# Patient Record
Sex: Male | Born: 1940 | Race: Black or African American | Hispanic: No | Smoking: Never smoker
Health system: Southern US, Community
[De-identification: ages and names within clinical notes are randomized; demographics above are authoritative.]

## PROBLEM LIST (undated history)

## (undated) DIAGNOSIS — J209 Acute bronchitis, unspecified: Secondary | ICD-10-CM

## (undated) DIAGNOSIS — E519 Thiamine deficiency, unspecified: Secondary | ICD-10-CM

## (undated) DIAGNOSIS — D649 Anemia, unspecified: Secondary | ICD-10-CM

## (undated) DIAGNOSIS — A0811 Acute gastroenteropathy due to Norwalk agent: Secondary | ICD-10-CM

## (undated) DIAGNOSIS — Z4800 Encounter for change or removal of nonsurgical wound dressing: Secondary | ICD-10-CM

## (undated) DIAGNOSIS — E569 Vitamin deficiency, unspecified: Secondary | ICD-10-CM

## (undated) DIAGNOSIS — G40109 Localization-related (focal) (partial) symptomatic epilepsy and epileptic syndromes with simple partial seizures, not intractable, without status epilepticus: Secondary | ICD-10-CM

## (undated) DIAGNOSIS — I479 Paroxysmal tachycardia, unspecified: Secondary | ICD-10-CM

## (undated) DIAGNOSIS — R1110 Vomiting, unspecified: Secondary | ICD-10-CM

## (undated) DIAGNOSIS — M6281 Muscle weakness (generalized): Secondary | ICD-10-CM

## (undated) DIAGNOSIS — IMO0002 Reserved for concepts with insufficient information to code with codable children: Secondary | ICD-10-CM

## (undated) DIAGNOSIS — I10 Essential (primary) hypertension: Secondary | ICD-10-CM

## (undated) DIAGNOSIS — Z8739 Personal history of other diseases of the musculoskeletal system and connective tissue: Secondary | ICD-10-CM

## (undated) DIAGNOSIS — R531 Weakness: Secondary | ICD-10-CM

## (undated) DIAGNOSIS — N390 Urinary tract infection, site not specified: Secondary | ICD-10-CM

## (undated) DIAGNOSIS — R569 Unspecified convulsions: Secondary | ICD-10-CM

---

## 2007-05-07 ENCOUNTER — Emergency Department: Payer: Self-pay | Admitting: Unknown Physician Specialty

## 2007-05-07 ENCOUNTER — Other Ambulatory Visit: Payer: Self-pay

## 2007-09-03 ENCOUNTER — Other Ambulatory Visit: Payer: Self-pay

## 2007-09-03 ENCOUNTER — Emergency Department: Payer: Self-pay | Admitting: Emergency Medicine

## 2008-08-17 ENCOUNTER — Emergency Department: Payer: Self-pay | Admitting: Emergency Medicine

## 2011-07-06 ENCOUNTER — Emergency Department: Payer: Self-pay | Admitting: Emergency Medicine

## 2011-09-08 ENCOUNTER — Inpatient Hospital Stay: Payer: Self-pay | Admitting: Internal Medicine

## 2012-02-05 ENCOUNTER — Inpatient Hospital Stay: Payer: Self-pay | Admitting: Internal Medicine

## 2012-02-05 LAB — COMPREHENSIVE METABOLIC PANEL
Albumin: 4.5 g/dL (ref 3.4–5.0)
Alkaline Phosphatase: 73 U/L (ref 50–136)
BUN: 46 mg/dL — ABNORMAL HIGH (ref 7–18)
Bilirubin,Total: 2.3 mg/dL — ABNORMAL HIGH (ref 0.2–1.0)
Co2: 19 mmol/L — ABNORMAL LOW (ref 21–32)
Creatinine: 1.9 mg/dL — ABNORMAL HIGH (ref 0.60–1.30)
EGFR (African American): 46 — ABNORMAL LOW
Glucose: 157 mg/dL — ABNORMAL HIGH (ref 65–99)
Osmolality: 315 (ref 275–301)
Sodium: 151 mmol/L — ABNORMAL HIGH (ref 136–145)
Total Protein: 9.6 g/dL — ABNORMAL HIGH (ref 6.4–8.2)

## 2012-02-05 LAB — CBC
HCT: 56.9 % — ABNORMAL HIGH (ref 40.0–52.0)
HGB: 19.1 g/dL — ABNORMAL HIGH (ref 13.0–18.0)
MCH: 33.8 pg (ref 26.0–34.0)
MCV: 101 fL — ABNORMAL HIGH (ref 80–100)
Platelet: 306 10*3/uL (ref 150–440)
RBC: 5.65 10*6/uL (ref 4.40–5.90)
RDW: 14.3 % (ref 11.5–14.5)
WBC: 7.4 10*3/uL (ref 3.8–10.6)

## 2012-02-05 LAB — URINALYSIS, COMPLETE
Bacteria: NONE SEEN
Glucose,UR: NEGATIVE mg/dL (ref 0–75)
Nitrite: NEGATIVE
Ph: 5 (ref 4.5–8.0)
Protein: 30
RBC,UR: NONE SEEN /HPF (ref 0–5)
Specific Gravity: 1.025 (ref 1.003–1.030)
WBC UR: NONE SEEN /HPF (ref 0–5)

## 2012-02-05 LAB — TROPONIN I: Troponin-I: 0.04 ng/mL

## 2012-02-05 LAB — ETHANOL: Ethanol: <3 mg/dL

## 2012-02-05 LAB — CK TOTAL AND CKMB (NOT AT ARMC): CK-MB: 6.7 ng/mL — ABNORMAL HIGH (ref 0.5–3.6)

## 2012-02-06 LAB — CBC WITH DIFFERENTIAL/PLATELET
HGB: 14.9 g/dL (ref 13.0–18.0)
MCH: 33.8 pg (ref 26.0–34.0)
MCHC: 33.8 g/dL (ref 32.0–36.0)
Metamyelocyte: 13 %
Myelocyte: 1 %
Other Cells Blood: 2
Platelet: 196 10*3/uL (ref 150–440)
Segmented Neutrophils: 46 %

## 2012-02-06 LAB — BASIC METABOLIC PANEL
Calcium, Total: 8.7 mg/dL (ref 8.5–10.1)
Co2: 21 mmol/L (ref 21–32)
EGFR (Non-African Amer.): 56 — ABNORMAL LOW
Glucose: 129 mg/dL — ABNORMAL HIGH (ref 65–99)
Potassium: 3.1 mmol/L — ABNORMAL LOW (ref 3.5–5.1)
Sodium: >160 mmol/L (ref 136–145)

## 2012-02-06 LAB — SODIUM: Sodium: >160 mmol/L (ref 136–145)

## 2012-02-06 LAB — HEPATIC FUNCTION PANEL A (ARMC)
Alkaline Phosphatase: 50 U/L (ref 50–136)
SGOT(AST): 52 U/L — ABNORMAL HIGH (ref 15–37)
SGPT (ALT): 57 U/L
Total Protein: 6.8 g/dL (ref 6.4–8.2)

## 2012-02-07 LAB — BASIC METABOLIC PANEL
Anion Gap: 5 — ABNORMAL LOW (ref 7–16)
BUN: 27 mg/dL — ABNORMAL HIGH (ref 7–18)
Calcium, Total: 8.4 mg/dL — ABNORMAL LOW (ref 8.5–10.1)
Creatinine: 1.09 mg/dL (ref 0.60–1.30)
EGFR (African American): >60
EGFR (Non-African Amer.): >60
Glucose: 106 mg/dL — ABNORMAL HIGH (ref 65–99)
Osmolality: 307 (ref 275–301)
Sodium: 152 mmol/L — ABNORMAL HIGH (ref 136–145)

## 2012-02-08 LAB — BASIC METABOLIC PANEL
Anion Gap: 11 (ref 7–16)
Calcium, Total: 8 mg/dL — ABNORMAL LOW (ref 8.5–10.1)
Chloride: 114 mmol/L — ABNORMAL HIGH (ref 98–107)
Co2: 22 mmol/L (ref 21–32)
Creatinine: 1.07 mg/dL (ref 0.60–1.30)
EGFR (African American): >60
Glucose: 113 mg/dL — ABNORMAL HIGH (ref 65–99)
Osmolality: 293 (ref 275–301)
Potassium: 3.3 mmol/L — ABNORMAL LOW (ref 3.5–5.1)

## 2012-02-08 LAB — URINE CULTURE

## 2012-02-09 LAB — BASIC METABOLIC PANEL
Anion Gap: 10 (ref 7–16)
Chloride: 116 mmol/L — ABNORMAL HIGH (ref 98–107)
Co2: 20 mmol/L — ABNORMAL LOW (ref 21–32)
Creatinine: 0.8 mg/dL (ref 0.60–1.30)
Osmolality: 288 (ref 275–301)
Sodium: 146 mmol/L — ABNORMAL HIGH (ref 136–145)

## 2012-02-11 LAB — CULTURE, BLOOD (SINGLE)

## 2012-07-28 LAB — CK: CK, Total: 6870 U/L — ABNORMAL HIGH (ref 35–232)

## 2012-07-28 LAB — CBC
HGB: 15 g/dL (ref 13.0–18.0)
MCHC: 33.2 g/dL (ref 32.0–36.0)
RBC: 4.98 10*6/uL (ref 4.40–5.90)
WBC: 17.1 10*3/uL — ABNORMAL HIGH (ref 3.8–10.6)

## 2012-07-28 LAB — COMPREHENSIVE METABOLIC PANEL
Alkaline Phosphatase: 73 U/L (ref 50–136)
Anion Gap: 10 (ref 7–16)
Bilirubin,Total: 1.4 mg/dL — ABNORMAL HIGH (ref 0.2–1.0)
Calcium, Total: 9 mg/dL (ref 8.5–10.1)
Chloride: 109 mmol/L — ABNORMAL HIGH (ref 98–107)
Co2: 24 mmol/L (ref 21–32)
Creatinine: 0.89 mg/dL (ref 0.60–1.30)
EGFR (Non-African Amer.): >60
Osmolality: 289 (ref 275–301)
Potassium: 3.8 mmol/L (ref 3.5–5.1)
SGPT (ALT): 27 U/L (ref 12–78)
Sodium: 143 mmol/L (ref 136–145)

## 2012-07-29 ENCOUNTER — Inpatient Hospital Stay: Payer: Self-pay | Admitting: Internal Medicine

## 2012-07-29 LAB — URINALYSIS, COMPLETE
Bilirubin,UR: NEGATIVE
Leukocyte Esterase: NEGATIVE
Ph: 5 (ref 4.5–8.0)
Protein: 100
RBC,UR: 1 /HPF (ref 0–5)
Squamous Epithelial: <1
WBC UR: 1 /HPF (ref 0–5)

## 2012-07-29 LAB — ETHANOL
Ethanol %: <0.003 % (ref 0.000–0.080)
Ethanol: <3 mg/dL

## 2012-07-29 LAB — CK: CK, Total: 6311 U/L — ABNORMAL HIGH (ref 35–232)

## 2012-07-30 LAB — COMPREHENSIVE METABOLIC PANEL
Albumin: 2.1 g/dL — ABNORMAL LOW (ref 3.4–5.0)
Albumin: 2.4 g/dL — ABNORMAL LOW (ref 3.4–5.0)
Anion Gap: 8 (ref 7–16)
Anion Gap: 10 (ref 7–16)
BUN: 17 mg/dL (ref 7–18)
BUN: 16 mg/dL (ref 7–18)
Bilirubin,Total: 0.9 mg/dL (ref 0.2–1.0)
Calcium, Total: 7.4 mg/dL — ABNORMAL LOW (ref 8.5–10.1)
Calcium, Total: 7.6 mg/dL — ABNORMAL LOW (ref 8.5–10.1)
EGFR (African American): >60
EGFR (African American): >60
EGFR (Non-African Amer.): >60
EGFR (Non-African Amer.): >60
Glucose: 128 mg/dL — ABNORMAL HIGH (ref 65–99)
Glucose: 104 mg/dL — ABNORMAL HIGH (ref 65–99)
Osmolality: 286 (ref 275–301)
Potassium: 3.2 mmol/L — ABNORMAL LOW (ref 3.5–5.1)
Potassium: 3.2 mmol/L — ABNORMAL LOW (ref 3.5–5.1)
SGOT(AST): 50 U/L — ABNORMAL HIGH (ref 15–37)
SGPT (ALT): 17 U/L (ref 12–78)
Sodium: 142 mmol/L (ref 136–145)
Sodium: 140 mmol/L (ref 136–145)
Total Protein: 6.8 g/dL (ref 6.4–8.2)

## 2012-07-30 LAB — CBC WITH DIFFERENTIAL/PLATELET
Comment - H1-Com4: NORMAL
HGB: 12.1 g/dL — ABNORMAL LOW (ref 13.0–18.0)
HGB: 12.6 g/dL — ABNORMAL LOW (ref 13.0–18.0)
Lymphocytes: 23 %
MCH: 31.4 pg (ref 26.0–34.0)
MCH: 30.4 pg (ref 26.0–34.0)
MCHC: 34.2 g/dL (ref 32.0–36.0)
MCV: 92 fL (ref 80–100)
MCV: 93 fL (ref 80–100)
Metamyelocyte: 1 %
Metamyelocyte: 2 %
Monocytes: 8 %
Platelet: 128 10*3/uL — ABNORMAL LOW (ref 150–440)
RBC: 3.84 10*6/uL — ABNORMAL LOW (ref 4.40–5.90)
RBC: 4.15 10*6/uL — ABNORMAL LOW (ref 4.40–5.90)
Segmented Neutrophils: 63 %
WBC: 7.4 10*3/uL (ref 3.8–10.6)
WBC: 8.3 10*3/uL (ref 3.8–10.6)

## 2012-07-30 LAB — MAGNESIUM: Magnesium: 2 mg/dL

## 2012-07-31 LAB — BASIC METABOLIC PANEL
Anion Gap: 9 (ref 7–16)
Chloride: 109 mmol/L — ABNORMAL HIGH (ref 98–107)
Co2: 23 mmol/L (ref 21–32)
Creatinine: 0.82 mg/dL (ref 0.60–1.30)
EGFR (African American): >60
EGFR (Non-African Amer.): >60
Glucose: 102 mg/dL — ABNORMAL HIGH (ref 65–99)
Potassium: 3.2 mmol/L — ABNORMAL LOW (ref 3.5–5.1)
Sodium: 141 mmol/L (ref 136–145)

## 2012-07-31 LAB — URINALYSIS, COMPLETE
Bilirubin,UR: NEGATIVE
Ketone: NEGATIVE
Leukocyte Esterase: NEGATIVE
Ph: 6 (ref 4.5–8.0)
Protein: NEGATIVE
RBC,UR: NONE SEEN /HPF (ref 0–5)
Specific Gravity: 1.009 (ref 1.003–1.030)
Squamous Epithelial: NONE SEEN
WBC UR: 1 /HPF (ref 0–5)

## 2012-07-31 LAB — CK: CK, Total: 1198 U/L — ABNORMAL HIGH (ref 35–232)

## 2012-08-01 LAB — BASIC METABOLIC PANEL WITH GFR
Anion Gap: 8
BUN: 6 mg/dL — ABNORMAL LOW
Calcium, Total: 8 mg/dL — ABNORMAL LOW
Chloride: 108 mmol/L — ABNORMAL HIGH
Co2: 25 mmol/L
Creatinine: 0.71 mg/dL
EGFR (African American): >60
EGFR (Non-African Amer.): >60
Glucose: 95 mg/dL
Osmolality: 279
Potassium: 3.7 mmol/L
Sodium: 141 mmol/L

## 2012-08-01 LAB — CBC WITH DIFFERENTIAL/PLATELET
HCT: 36.7 % — ABNORMAL LOW (ref 40.0–52.0)
HGB: 12.5 g/dL — ABNORMAL LOW (ref 13.0–18.0)
Lymphocytes: 15 %
MCV: 91 fL (ref 80–100)
Monocytes: 15 %
Platelet: 145 10*3/uL — ABNORMAL LOW (ref 150–440)
Promyelocyte: 1 %
RBC: 4.02 10*6/uL — ABNORMAL LOW (ref 4.40–5.90)
RDW: 14 % (ref 11.5–14.5)
Segmented Neutrophils: 64 %
WBC: 7.6 10*3/uL (ref 3.8–10.6)

## 2012-09-01 ENCOUNTER — Observation Stay: Payer: Self-pay | Admitting: Internal Medicine

## 2012-09-01 LAB — URINALYSIS, COMPLETE
Bilirubin,UR: NEGATIVE
Glucose,UR: NEGATIVE mg/dL (ref 0–75)
Ketone: NEGATIVE
Nitrite: NEGATIVE
Ph: 5 (ref 4.5–8.0)
Protein: 30
RBC,UR: 3 /HPF (ref 0–5)
Specific Gravity: 1.025 (ref 1.003–1.030)
WBC UR: 8 /HPF (ref 0–5)

## 2012-09-01 LAB — COMPREHENSIVE METABOLIC PANEL
Albumin: 2.4 g/dL — ABNORMAL LOW (ref 3.4–5.0)
Alkaline Phosphatase: 153 U/L — ABNORMAL HIGH (ref 50–136)
Anion Gap: 10 (ref 7–16)
Bilirubin,Total: 1.1 mg/dL — ABNORMAL HIGH (ref 0.2–1.0)
Calcium, Total: 8.2 mg/dL — ABNORMAL LOW (ref 8.5–10.1)
Co2: 26 mmol/L (ref 21–32)
Creatinine: 0.82 mg/dL (ref 0.60–1.30)
EGFR (Non-African Amer.): >60
Glucose: 99 mg/dL (ref 65–99)
Osmolality: 285 (ref 275–301)
Potassium: 3.3 mmol/L — ABNORMAL LOW (ref 3.5–5.1)
Sodium: 142 mmol/L (ref 136–145)

## 2012-09-01 LAB — PROTIME-INR: Prothrombin Time: 13.7 secs (ref 11.5–14.7)

## 2012-09-01 LAB — CBC
HCT: 34 % — ABNORMAL LOW (ref 40.0–52.0)
HGB: 11.5 g/dL — ABNORMAL LOW (ref 13.0–18.0)
RBC: 3.67 10*6/uL — ABNORMAL LOW (ref 4.40–5.90)
WBC: 8.4 10*3/uL (ref 3.8–10.6)

## 2012-09-01 LAB — DRUG SCREEN, URINE
Amphetamines, Ur Screen: NEGATIVE (ref ?–1000)
Barbiturates, Ur Screen: NEGATIVE (ref ?–200)
MDMA (Ecstasy)Ur Screen: NEGATIVE (ref ?–500)
Opiate, Ur Screen: NEGATIVE (ref ?–300)
Phencyclidine (PCP) Ur S: NEGATIVE (ref ?–25)
Tricyclic, Ur Screen: NEGATIVE (ref ?–1000)

## 2012-09-01 LAB — MAGNESIUM: Magnesium: 1.8 mg/dL

## 2012-09-01 LAB — TROPONIN I: Troponin-I: <0.02 ng/mL

## 2012-09-02 LAB — BASIC METABOLIC PANEL
Calcium, Total: 8.1 mg/dL — ABNORMAL LOW (ref 8.5–10.1)
Chloride: 109 mmol/L — ABNORMAL HIGH (ref 98–107)
Co2: 26 mmol/L (ref 21–32)
EGFR (African American): >60
EGFR (Non-African Amer.): >60
Glucose: 117 mg/dL — ABNORMAL HIGH (ref 65–99)
Osmolality: 291 (ref 275–301)
Potassium: 3.4 mmol/L — ABNORMAL LOW (ref 3.5–5.1)
Sodium: 145 mmol/L (ref 136–145)

## 2012-09-02 LAB — CK: CK, Total: 136 U/L (ref 35–232)

## 2013-01-08 ENCOUNTER — Ambulatory Visit: Payer: Self-pay | Admitting: Gastroenterology

## 2013-04-02 ENCOUNTER — Ambulatory Visit: Payer: Self-pay | Admitting: Family Medicine

## 2013-09-22 ENCOUNTER — Ambulatory Visit: Payer: Self-pay | Admitting: Family Medicine

## 2014-05-22 ENCOUNTER — Other Ambulatory Visit: Payer: Self-pay | Admitting: Family Medicine

## 2014-05-22 LAB — IRON AND TIBC
IRON SATURATION: 30 %
Iron Bind.Cap.(Total): 239 ug/dL — ABNORMAL LOW (ref 250–450)
Iron: 71 ug/dL (ref 65–175)
Unbound Iron-Bind.Cap.: 168 ug/dL

## 2014-05-22 LAB — COMPREHENSIVE METABOLIC PANEL
ALBUMIN: 3 g/dL — AB (ref 3.4–5.0)
AST: 12 U/L — AB (ref 15–37)
Alkaline Phosphatase: 85 U/L
Anion Gap: 7 (ref 7–16)
BUN: 5 mg/dL — ABNORMAL LOW (ref 7–18)
Bilirubin,Total: 0.3 mg/dL (ref 0.2–1.0)
Calcium, Total: 8.4 mg/dL — ABNORMAL LOW (ref 8.5–10.1)
Chloride: 108 mmol/L — ABNORMAL HIGH (ref 98–107)
Co2: 26 mmol/L (ref 21–32)
Creatinine: 0.76 mg/dL (ref 0.60–1.30)
EGFR (Non-African Amer.): >60
GLUCOSE: 111 mg/dL — AB (ref 65–99)
Osmolality: 279 (ref 275–301)
Potassium: 4 mmol/L (ref 3.5–5.1)
SGPT (ALT): 13 U/L (ref 12–78)
Sodium: 141 mmol/L (ref 136–145)
Total Protein: 7.2 g/dL (ref 6.4–8.2)

## 2014-05-22 LAB — CBC WITH DIFFERENTIAL/PLATELET
COMMENT - H1-COM1: NORMAL
HCT: 38.1 % — ABNORMAL LOW (ref 40.0–52.0)
HGB: 12.4 g/dL — ABNORMAL LOW (ref 13.0–18.0)
Lymphocytes: 34 %
MCH: 30.3 pg (ref 26.0–34.0)
MCHC: 32.5 g/dL (ref 32.0–36.0)
MCV: 93 fL (ref 80–100)
Monocytes: 11 %
Myelocyte: 2 %
PLATELETS: 187 10*3/uL (ref 150–440)
RBC: 4.09 10*6/uL — ABNORMAL LOW (ref 4.40–5.90)
RDW: 13.8 % (ref 11.5–14.5)
SEGMENTED NEUTROPHILS: 53 %
WBC: 4.8 10*3/uL (ref 3.8–10.6)

## 2014-05-22 LAB — LIPID PANEL
CHOLESTEROL: 100 mg/dL (ref 0–200)
HDL Cholesterol: 25 mg/dL — ABNORMAL LOW (ref 40–60)
Ldl Cholesterol, Calc: 53 mg/dL (ref 0–100)
TRIGLYCERIDES: 109 mg/dL (ref 0–200)
VLDL Cholesterol, Calc: 22 mg/dL (ref 5–40)

## 2014-05-22 LAB — FOLATE: Folic Acid: 68.5 ng/mL (ref 3.1–100.0)

## 2015-02-10 ENCOUNTER — Other Ambulatory Visit: Payer: Self-pay | Admitting: Family Medicine

## 2015-02-10 LAB — URINALYSIS, COMPLETE
Bacteria: NONE SEEN
Bilirubin,UR: NEGATIVE
Glucose,UR: NEGATIVE mg/dL
Ketone: NEGATIVE
Leukocyte Esterase: NEGATIVE
Nitrite: NEGATIVE
Ph: 5
Protein: 30
RBC,UR: 2 /HPF
Specific Gravity: 1.025
Squamous Epithelial: NONE SEEN
WBC UR: 3 /HPF

## 2015-02-10 LAB — CBC WITH DIFFERENTIAL/PLATELET
Basophil #: 0.1 x10 3/mm 3
Basophil %: 0.7 %
Eosinophil #: 0 x10 3/mm 3
Eosinophil %: 0.5 %
HCT: 41.8 %
HGB: 13.9 g/dL
Lymphocyte %: 21.1 %
Lymphs Abs: 1.6 x10 3/mm 3
MCH: 30.6 pg
MCHC: 33.3 g/dL
MCV: 92 fL
Monocyte #: 2.1 "x10 3/mm " — ABNORMAL HIGH
Monocyte %: 27.4 %
Neutrophil #: 3.9 x10 3/mm 3
Neutrophil %: 50.3 %
Platelet: 81 x10 3/mm 3 — ABNORMAL LOW
RBC: 4.55 x10 6/mm 3
RDW: 13.9 %
WBC: 7.8 x10 3/mm 3

## 2015-02-11 LAB — COMPREHENSIVE METABOLIC PANEL
ALT: 16 U/L — AB
Albumin: 3.5 g/dL
Alkaline Phosphatase: 51 U/L
Anion Gap: 7 (ref 7–16)
BILIRUBIN TOTAL: 0.8 mg/dL
BUN: 15 mg/dL
CALCIUM: 8.3 mg/dL — AB
CHLORIDE: 107 mmol/L
Co2: 29 mmol/L
Creatinine: 0.78 mg/dL
EGFR (African American): >60
Glucose: 116 mg/dL — ABNORMAL HIGH
POTASSIUM: 3.4 mmol/L — AB
SGOT(AST): 22 U/L
SODIUM: 143 mmol/L
Total Protein: 6.7 g/dL

## 2015-02-12 LAB — URINE CULTURE

## 2015-03-09 NOTE — H&P (Signed)
PATIENT NAME:  Nathan Figueroa, Nathan Figueroa MR#:  [id number] DATE OF BIRTH:  12/23/1940  DATE OF ADMISSION:  07/29/2012  PRIMARY CARE PHYSICIAN: [facility] [employer] [facility].   CHIEF COMPLAINT: Found on the floor of the bathroom.   HISTORY OF PRESENT ILLNESS: Mr. Nathan Figueroa is a 74 year old African American male with a history of chronic alcoholism. The last time admitted to this hospital was in March of this year when he was found on the floor unresponsive. He was treated for metabolic encephalopathy, possible seizure The patient lives at home alone and, again, he was found in the bathroom on the floor between the toilet and the wall. He has some bruises over the back area and the shoulder and lower back. The patient is not providing much history. He thinks he is at home. He is sleepy and lethargic, but arousable. He does not give any details or information of what exactly happened. He indicated that he was not taking his medications for several weeks. He admits that he is still drinking alcohol. There is evidence of rhabdomyolysis with CPK reaching 6,870. He is slightly tachycardic and there is evidence of leukocytosis and the source is unclear. The patient was admitted for further evaluation and management.   REVIEW OF SYSTEMS: Ten point system review is unobtainable due to the patient's lethargy, altered mental status and confusion.   PAST MEDICAL HISTORY:  1. Chronic alcoholism. Last admission here was in March of this year with unresponsive episode when he was found on the floor with metabolic encephalopathy and possible seizure. It was unclear whether it was withdrawal seizure from his alcohol or it was primary seizure disorder. 2. History of hypertension.  3. Also, his medical records indicate history of prostatic cancer.   SOCIAL HABITS: Nonsmoker. He is a chronic alcoholic, primarily beer, the amount is unknown. It is also unclear when was his last drink.   PAST SURGICAL HISTORY: Prostatic surgery.    FAMILY HISTORY: Unobtainable due to the patient's altered mental status. However, his records state that there is a family history of hypertension.   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 He is a chronic alcoholic.   ADMISSION MEDICATIONS: None. He has not been taking his medications for several weeks. According to the records from this hospital, in March he was discharged on the following medications. He is supposed to be on: 1. Thiamine 100 mg daily. 2. Folic acid 1 mg daily. 3. Keppra 750 mg twice a day. 4. Norvasc 5 mg a day.  5. Ativan p.r.n. for his anxiety.   ALLERGIES: No known drug allergies.   PHYSICAL EXAMINATION:  VITAL SIGNS: Blood pressure 147/96, respiratory rate 18, pulse 121, temperature 98.4, oxygen saturation 93%.   GENERAL APPEARANCE: Elderly male. lethargic, lying down on stretcher, sleepy but arousable, in no acute distress.   HEAD: No pallor. No icterus. No cyanosis.   ENT: Hearing was normal. Nasal mucosa, lips, tongue were normal.   EYES: Normal eyelids and conjunctiva. Pupils about 5 mm, equal and reactive to light.   NECK: Supple. Trachea at midline. No thyromegaly. No cervical lymphadenopathy. No masses.   HEART: Normal S1, S2. No S3, S4. No murmur. No gallop. No carotid bruits.   LUNGS: Normal breathing pattern without use of accessory muscles. No rales. No wheezing.   ABDOMEN: Soft without tenderness. No hepatosplenomegaly. No masses. No hernias.   SKIN: Erythema over the back of the shoulder especially on the left and lower back area and buttock secondary  to his fall. There is also some thickening and redness of glans penis and mild superficial ulcer.   MUSCULOSKELETAL: No joint swelling. No clubbing.   NEUROLOGIC: Cranial nerves II through XII are intact. No focal motor deficit. He is able to move his four extremities.   PSYCHIATRIC: The patient is disoriented to place and time. He is confused and appears to be somewhat  encephalopathic.   LABORATORY, DIAGNOSTIC, AND RADIOLOGICAL DATA: EKG showed sinus tachycardia at rate of 123 per minute. Nonspecific T wave abnormalities. Incomplete right bundle branch block, otherwise unremarkable EKG. 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 X-ray of the left shoulder showed no evidence of fracture or any acute abnormality. X-ray of the thoracic spine showed no evidence of acute fracture. Similarly, x-ray of the lumbar spine showed no acute fracture. Serum glucose 124, BUN 21, creatinine 0.8, sodium 143, potassium 3.8, calcium 9. Liver function tests showed protein 8, albumin 3.1. Bilirubin 1.4. AST elevated 116, ALT 27, alkaline phosphatase 73. Total CPK was 6,870. Troponin is normal at 0.04. CBC showed white count of 17,000, hemoglobin 15, hematocrit 45, platelet count 183. Urinalysis was unremarkable except for +3 blood. However, his RBC by microscopy showed only one red blood cell and one white blood cell.   ASSESSMENT:  1. Altered mental status likely secondary to metabolic encephalopathy, question whether he is in postictal phase.  2. Rhabdomyolysis.  3. Seizure disorder, unclear whether this is withdrawal from alcohol or primary seizure activity. It is unclear whether he had seizure activity at home or not.  4. Leukocytosis reaching 17,000. The source is unclear at the time being.  5. Sinus tachycardia. 6. Alcoholic hepatitis.  7. Systemic hypertension.  8. Chronic alcoholism.  9. Noncompliance.  10. History of prostatic cancer.   PLAN:  1. Will admit the patient to the medical floor.  2. Follow-up on his neurologic examination.  3. IV hydration with normal saline. 4. Follow up on the CPK and also kidney function to ensure no compromise to the renal function. 5. Check serum alcohol level.  6. I will resume his medications, the  amlodipine and Keppra.  7. I will consult the social services to look at his situation at home to see if there is any way to prevent readmissions for the same of being alone and unsupervised. I do not have any information from prior records whether he has a LIVING WILL or not and the patient cannot provide me with any meaningful information due to his altered mental status.   TIME SPENT IN EVALUATING THIS PATIENT AND REVIEWING HIS MEDICAL RECORDS: More than 55 minutes.   ____________________________ Carney CornersAmir M. Rudene Rearwish, MD amd:ap D: 07/29/2012 01:58:54 ET T: 07/29/2012 09:58:42 ET JOB#: [id number]  cc: Carney CornersAmir M. Rudene Rearwish, MD, <Dictator> [facility] Carney CornersAMIR M Brenetta Penny MD ELECTRONICALLY SIGNED 07/29/2012 22:25

## 2015-03-09 NOTE — Discharge Summary (Signed)
PATIENT NAME:  Nathan NeighboursWILLIAMSON, Sebastin L MR#:  [id number] DATE OF BIRTH:  Nov 16, 1941  DATE OF ADMISSION:  09/01/2012 DATE OF DISCHARGE:  09/02/2012  Discharged to [facility] skilled nursing facility.   DISCHARGE DIAGNOSES:  1. Weakness and ambulation dysfunction chronic secondary to alcohol abuse and prior rhabdomyolysis.  2. Arthritis.  3. History of seizures.  4. Hypertension.  5. Decubitus ulcers.  6. Mild hypokalemia.  7. Stable chronic anemia.   IMAGING STUDIES: 1. CT scan of the head without contrast which showed no acute intracranial process.  2. X-ray of the bilateral ankles showed no acute fracture or dislocation.   ADMITTING HISTORY AND PHYSICAL: Please see detailed history and physical dictated by Dr. Cherlynn KaiserSainani on 09/01/2012. In brief, this is a 74 year old Caucasian male patient with history of rhabdomyolysis and alcohol abuse who recently was discharged to [facility] skilled nursing facility, was sent home two weeks back under his sister's care. The patient was extremely weak since his discharge from the skilled nursing facility and his sister was unable to care for him and brought him to the Emergency Room. The patient has been admitted to the hospitalist service.   HOSPITAL COURSE: For his weakness, the patient was seen by physical therapy, was recommended skilled nursing facility and he has been accepted back to [facility] where he is being transferred after discussing with his sister who is his power of attorney. The patient complained of bilateral ankle pain which is chronic but worsening and had x-rays which showed arthritis but no fractures or dislocation. His hypertension and seizures were well controlled during the hospital stay.   On the day of discharge, the patient is afebrile. Blood pressure 137/84, saturating 95% on room air with cardiovascular examination being normal. Some tenderness in the bilateral ankles and is being discharged to  skilled nursing facility in fair condition.   DISCHARGE MEDICATIONS:  1. Ultram 50 mg oral 4 times a day as needed for pain.  2. Acetaminophen 650 mg oral every four hours as needed for pain or fever.  3. Keppra 750 mg oral twice a day.  4. Norvasc 5 mg oral once a day.  5. Folic acid 1 mg oral once a day.  6. Thiamine 100 mg oral once a day.   DISCHARGE INSTRUCTIONS: Discharge to skilled nursing facility for further physical therapy. The patient will be on activity as tolerated with assistance at all times to prevent any falls. He will be on cardiac diet. This plan was discussed with the patient and his sister who have verbalized understanding and are okay with the plan.   TIME SPENT TODAY ON THIS DISCHARGE DICTATION ALONG WITH COORDINATING CARE AND COUNSELING OF THE PATIENT: 40 minutes.   ____________________________ Molinda BailiffSrikar R. Connee Ikner, MD srs:ap D: 09/02/2012 14:26:10 ET T: 09/02/2012 14:45:01 ET JOB#: [id number]  cc: Wardell HeathSrikar R. Vonne Mcdanel, MD, <Dictator> Orie FishermanSRIKAR R Vikkie Goeden MD ELECTRONICALLY SIGNED 09/16/2012 13:57

## 2015-03-09 NOTE — H&P (Signed)
PATIENT NAME:  Nathan Figueroa, Nathan Figueroa MR#:  [id number] DATE OF BIRTH:  1941-04-02  DATE OF ADMISSION:  09/01/2012  PRIMARY CARE PHYSICIAN: [facility] [place]   CHIEF COMPLAINT: Difficulty ambulating and generalized weakness.   HISTORY OF PRESENT ILLNESS: This is a 74 year old male who was brought into the Emergency Room today due to generalized weakness and difficulty ambulating and being sitting in one position at home for the past 3 to 4 days. The patient was hospitalized about a month ago for acute rhabdomyolysis and being found down about a month ago, was noted to have significant weakness and, therefore, discharged to a skilled nursing facility for rehab. The patient was discharged from rehab about eight days ago and sent home and his sister watches him at home. As per the sister, the patient has not been doing well at home and he has not been able to get out of bed. He has been lying in the same position for about four days. His p.o. intake has been poor. He has been complaining of generalized body aches all throughout. He significantly mostly complains of lower extremity pain when you try to move him and also has multiple stage II to III decubitus ulcers on his buttocks. The patient due to his poor mobility and significant pain was sent over to the ER for further evaluation. The patient had initial work-up which shows no evidence of acute dehydration or any urinary tract infection or any acute infectious etiology. Hospitalist services were contacted for further treatment and evaluation.   REVIEW OF SYSTEMS: Currently unobtainable given the fact that the patient got some Valium and some morphine for the pain.   PAST MEDICAL HISTORY:  1. History of heavy alcohol abuse.  2. History of alcohol withdrawal seizures. 3. Hypertension. 4. History of rhabdomyolysis.   ALLERGIES: No known drug allergies.   SOCIAL HISTORY: No smoking. Has a history of heavy alcohol abuse but has not drank since his last  hospitalization. No illicit drug abuse. Lives at home with his sister presently.   FAMILY HISTORY: Unobtainable given the patient's mental status and lethargy, however, as per records there is a family history of hypertension.   MEDICATIONS BASED ON HIS PREVIOUS DISCHARGE SUMMARY: 1. Tylenol 650 q.4 hours as needed.  2. Keppra 750 mg b.i.d.  3. Amlodipine 5 mg daily.  4. Folic acid 1 mg daily.  5. Thiamine 100 mg daily.   PHYSICAL EXAMINATION:   VITAL SIGNS: Temperature 98.1, pulse 112, respirations 20, blood pressure 117/90, sats 97% on room air.   GENERAL: He is a lethargic-appearing male in bed but in no apparent distress.   HEENT: Atraumatic, normocephalic. His extraocular muscles are intact. His pupils are equal and reactive to light. Sclerae anicteric. No conjunctival injection. No oropharyngeal erythema.   NECK: Supple. No jugular venous distention, no bruits, no lymphadenopathy, no thyromegaly.   HEART: Regular rate and rhythm, tachycardic. No murmurs, no rubs, no clicks.   LUNGS: Clear to auscultation bilaterally. No rales, no rhonchi, no wheezes.   ABDOMEN: Soft, flat, nontender, nondistended. Has good bowel sounds. No hepatosplenomegaly appreciated.   EXTREMITIES: No evidence of any cyanosis, clubbing, or peripheral edema. Has +2 pedal and radial pulses bilaterally.   NEUROLOGICAL: He is alert, awake, and oriented x1. Globally weak. Difficult to do a full neurological exam. Complains of significant pain on passive motion of his lower extremities.   SKIN: Moist and warm. He does have multiple stage II to III decubitus on his buttocks. He also has some  area of redness in his left lower extremity which extends from his knees all the way down to the ankle likely from being in the same position for a long period of time.   LYMPHATIC: There is no cervical or axillary lymphadenopathy.   LABORATORY, DIAGNOSTIC, AND RADIOLOGICAL DATA: Serum glucose 99, BUN 18, creatinine 0.8,  sodium 142, potassium 3.3, chloride 106, bicarb 26. LFTs are within normal limits. Albumin 2.4. Urine toxicology is negative. White cell count 8.4, hemoglobin 11.5, hematocrit 34.0, platelet count 316. INR 1.0.   The patient did have a CT of the head done without contrast which showed no acute intracranial process.   The patient also had x-ray of his left ankle which shows no acute fracture.  An x-ray of his right ankle shows also no acute fracture.  X-ray of his right shoulder shows no acute fracture or dislocation.  Chest x-ray showed no evidence of acute cardiopulmonary disease.   ASSESSMENT AND PLAN: This is a 74 year old male with history of alcohol abuse, history of alcohol withdrawal seizures, dementia, and history of rhabdomyolysis who presents to the hospital due to generalized weakness and difficulty ambulating. The patient apparently has been sitting in the recliner for the past 4 to 5 days and has not moved well.  1. Weakness/ambulation dysfunction. This is likely chronic in nature related to his deconditioning from his heavy alcohol abuse. He was just discharged from the hospital to [facility] about a month ago and then discharged from [facility] about eight days ago and now cannot get around well. I will get a physical therapy consult to assess his mobility. Will also get Case Management involved for long-term care placement. I do not appreciate any evidence of acute focal metabolic or neurologic abnormality as suspect the cause of his weakness.  2. History of alcohol withdrawal and seizures. The patient has had no history of alcohol use since he was discharged and he has had no seizures. I will continue his Keppra for now.  3. Hypertension. I will continue his Norvasc. 4. Decubitus ulcers likely due to poor mobility. The patient has been followed by the wound team during the last hospitalization. I will reconsult them. Continue local wound care for now.   Plan was discussed  with the patient's power-of-attorney, the sister, and she agrees with the management.   CODE STATUS: The patient is a DO NOT INTUBATE/DO NOT RESUSCITATE.   TIME SPENT: 50 minutes.   ____________________________ Rolly PancakeVivek J. Cherlynn KaiserSainani, MD vjs:drc D: 09/01/2012 21:13:52 ET T: 09/02/2012 08:13:51 ET JOB#: [id number]  cc: Rolly PancakeVivek J. Cherlynn KaiserSainani, MD, <Dictator> [facility] SirenVIVEK J SAINANI MD ELECTRONICALLY SIGNED 09/02/2012 13:47

## 2015-03-09 NOTE — Discharge Summary (Signed)
PATIENT NAME:  Nathan NeighboursWILLIAMSON, Derrian L MR#:  [id number] DATE OF BIRTH:  June 24, 1941  DATE OF ADMISSION:  07/29/2012 DATE OF DISCHARGE:  08/01/2012  DISCHARGE DIAGNOSES: 1. Altered mental status possibly due to alcohol use and alcohol withdrawal seizure. The patient was admitted with similar symptoms in March 2013.  2. Rhabdomyolysis due to lying in the same position for a prolonged period of time.  3. Leukocytosis, resolved. 4. Seizures, possibly alcohol withdrawal versus underlying seizure disorder. 5. Hypertension. 6. Elevated liver function tests due to alcohol use.  7. Hypokalemia.  8. Febrile illness possibly due to wound/decubitus acquired on the buttock area due to lying in the same spot for a prolonged period of time.   DISPOSITION: The patient is being discharged to a rehab facility.   DIET: Low sodium.   DISCHARGE ACTIVITY: As tolerated.   DISCHARGE INSTRUCTIONS: The patient is encouraged to ambulate and use incentive spirometry. He should also followup with the [facility].   DISCHARGE MEDICATIONS:  1. Tylenol 650 mg every four hours p.r.n.  2. Keppra 750 mg twice a day. 3. Amlodipine 5 mg daily.  4. Amoxicillin 875/125 mg twice a day. 5. Folic acid 1 mg daily.  6. Thiamine 100 mg daily.   RESULTS: CT of the head showed no acute abnormalities.   The patient had three chest x-rays. The last one was done on the day of discharge and showed no evidence of an infiltrate.   Microbiology: Blood cultures negative so far.   Urinalysis showed no evidence of any infection.   CBC: White count was 17.1 on admission and has been normal thereafter, hemoglobin 15 to 12.5, and platelet count 128 to 183. CK was 6870 on admission and 975 by the time of discharge. AST 116 on admission and 50 by the time of discharge. Potassium 3.2, supplemented and normal by the time of discharge. Glucose 124. BUN and creatinine are normal. The rest of electrolytes are normal.   HOSPITAL COURSE: The  patient is a 74 year old male with past medical history of alcohol abuse who was admitted with metabolic encephalopathy and possible alcohol withdrawal seizure in March 2013. He was brought in by a family member after he was found on the floor of his house. He had been lying in the same position for a prolonged period of time. His CAT scan of the head was negative. Old records indicated that he did have some underlying dementia. He was treated with IV fluids for his rhabdomyolysis. His CK has continued to improve. His urine function and urine output has been good. He had leukocytosis, possibly reactive, initially. His urinalysis and chest x-ray were negative on admission. He possibly has alcohol withdrawal seizure versus underlying seizure disorder and was discharged on Keppra during his last admission; this has been restarted. The patient has had no further evidence of seizures while in the hospital. His blood pressure remained well controlled on Norvasc. He received potassium replacement. He had intermittent fever. Fever work-up including CBC showed normal white count, chest x-ray showed no infiltrate, blood culture showed no growth, and repeat urinalysis showed no evidence of infection. It was felt that the patient possibly had atelectasis because he was not very mobile and did not want to move much. He also had an area of skin breakdown/blistering on his buttocks that he acquired resulting from lying in the same position for a prolonged period of time. The blisters had ruptured and had some areas of surrounding erythema and raw areas. He is being  discharged on empiric antibiotics and will followup with the [facility].   TIME SPENT: 45 minutes.  ____________________________ Darrick Meigs, MD sp:slb D: 08/01/2012 10:46:05 ET T: 08/01/2012 11:06:27 ET JOB#: [id number]  cc: Darrick Meigs, MD, <Dictator> Darrick Meigs MD ELECTRONICALLY SIGNED 08/01/2012 16:01

## 2015-03-14 NOTE — H&P (Signed)
PATIENT NAME:  Nathan Figueroa MR#:  [id number] DATE OF BIRTH:  05-23-1941  DATE OF ADMISSION:  02/05/2012  PRIMARY CARE PHYS[facility]  CHIEF COMPLAINT: Decrease in responsiveness, found on the floor.   HISTORY OF PRESENT ILLNESS: Patient is a 74 year old African American male with history of alcohol abuse who was last seen by family about five days ago who did not call them, therefore, they became concerned and went to check on him today. They noticed him on the floor unresponsive. Patient had some bruising in his lower extremity and was brought to the ED. In the ER he was very dehydrated, was given IV fluids. He started waking up a little bit and then was given Valium because he was a little tachycardic and was having some jerking by the ED physician. Since receiving the Valium patient again has become very lethargic and drowsy. His family is at bedside. They report that other than that prior to five days he was his baseline. He had not complained of any chest pains. No fevers. No coughing. Had not complained of any abdominal pain, nausea, vomiting, or diarrhea. Patient otherwise currently is unable to give me any review of systems. In the ED his CT scan of the head was negative for any acute bleed or any other abnormalities. He is noted to be severely dehydrated with elevated sodium and a creatinine that is high. Patient also has abnormal liver function tests. Otherwise no other review of systems unobtainable.   PAST MEDICAL HISTORY:  1. Hypertension.  2. Seizure, unclear whether this is related to withdrawal.  3. History of prostate cancer.   PAST SURGICAL HISTORY: Required surgery for prostate cancer in the past.   ALLERGIES: None.   MEDICATIONS:  1. Thiamine 100 daily.  2. Norvasc 5 daily.  3. Lisinopril daily, dose is not known.  4. Keppra 750, 1 tab p.o. b.i.d.  5. Folic acid 1 mg p.o. daily.   SOCIAL HISTORY: No smoking but drinks heavily; according to his daughter as many  beers as he can get his hands on and he lives alone.   FAMILY HISTORY: Positive for hypertension.   REVIEW OF SYSTEMS: Unobtainable.   PHYSICAL EXAMINATION:  VITAL SIGNS: Temperature 99.2, pulse 122, respirations 22, blood pressure 128/101, O2 100%.   GENERAL: Patient currently is poorly responsive, opens his eyes half way and then falls back asleep again. This is after the Valium that he received.   HEENT: Head atraumatic, normocephalic. Pupils equal, round, reactive to light and accommodation. He has got some erythema under his eyelids on both eyes. Nasal exam shows no drainage or ulceration. Oropharynx is very dry. External ear exam shows no erythema or drainage. Nasal exam shows no drainage or ulceration.   NECK: There is no thyromegaly. No carotid bruits.   CARDIOVASCULAR: Tachycardic. No murmurs, rubs, clicks, or gallops. PMI is not displaced.   LUNGS: Clear to auscultation bilaterally without any rales, rhonchi, wheezing.   ABDOMEN: Soft, nontender, nondistended. Positive bowel sounds x4.   EXTREMITIES: He has got no clubbing, cyanosis, edema. Some bruising in both of his lower extremities.   SKIN: There is nonspecific erythema under his eyes as well as areas of bruising in his lower extremity. No other ulceration. Skin is very dry.   VASCULAR: Good DP, PT pulses.   NEUROLOGICAL: Patient is very lethargic, not able to follow commands. Was moving all extremities spontaneously earlier. Babinski's downgoing.   LYMPHATICS: No lymph nodes palpable.   MUSCULOSKELETAL: There is no  erythema or swelling.   LABORATORY, DIAGNOSTIC, AND RADIOLOGICAL DATA: Evaluations in the ED: Glucose 157, BUN 46, creatinine 1.90, sodium 151, potassium 4.4, chloride 116, CO2 19, anion gap 16, calcium 10.9, total protein 9.6, albumin 4.5, bilirubin total 2.3, alkaline phosphatase 73, AST 86, ALT 86. Troponin 0.4. CPK 380, WBC 7.4, hemoglobin 19.1, platelet count 306. Urinalysis shows blood 1+, leukocyte  esterase 1+, WBCs none seen. ABG: pH 7.38, pCO2 21, pO2 73, FiO2 32. CT scan of the head shows no acute abnormality. Chest x-ray PA and lateral shows no acute abnormality.   ASSESSMENT AND PLAN: Patient is a 74 year old African American male with history of alcohol abuse who was last seen five days ago by his family brought in because of unresponsiveness.  1. Unresponsiveness likely due to severe dehydration. He also has a history of alcohol abuse so could be related to withdrawal, also could be seizure. He has a history of seizure. He could have had seizure. Patient's mental status was improving, however, because he was having some tachycardia and jerking he was given Valium, now he is again poorly responsive. Will continue aggressive IV fluids. Follow his mental status. If does not improve will need a neurology evaluation, MRI.  2. Acute renal failure likely due to dehydration. Will give him IV fluids. Follow his renal function.  3. Alcohol abuse. Will place him on CIWA protocol. Will only use haloperidol because he is already drowsy. Not use Ativan, may cause excessive sedation.  4. Hypernatremia likely due to dehydration. Again will give him aggressive IV fluids.  5. Sinus tachycardia, likely due to dehydration. Will monitor him on tele. Follow serial cardiac enzymes.  6. Elevated liver function tests. Again likely due to alcohol abuse. Will repeat LFTs in the morning. May need a liver ultrasound once a little bit more stable.  7. Elevated blood sugar. Will check a hemoglobin A1c.   8. Possible urinary tract infection based on his urinalysis. Will get urine cultures. Place him on Cipro for time being.  9. Miscellaneous: Will place him on Lovenox for deep vein thrombosis prophylaxis.       TIME SPENT: 35 minutes.   ____________________________ Lacie Scotts Allena Katz, MD shp:cms D: 02/05/2012 20:46:00 ET T: 02/06/2012 06:13:14 ET JOB#: [id number]  cc: Maleke Feria H. Allena Katz, MD, <Dictator> [facility]  Charise Carwin MD ELECTRONICALLY SIGNED 02/07/2012 22:15

## 2015-03-14 NOTE — Discharge Summary (Signed)
PATIENT NAME:  Nathan Figueroa, Nathan Figueroa MR#:  [id number] DATE OF BIRTH:  October 06, 1941  DATE OF ADMISSION:  02/05/2012 DATE OF DISCHARGE:  02/09/2012  ADMITTING PHYSICIAN: Auburn Bilberry, MD  DISCHARGING PHYSICIAN: Enid Baas, MD  PRIMARY CARE PHYSICIAN: [facility]  CONSULTANTS: None.  DISCHARGE DIAGNOSES:  1. Unresponsive episode, was found on the floor.  2. Metabolic encephalopathy.  3. History of possible seizure, withdrawal seizure versus primary seizure disorder.  4. Chronic alcohol abuse.  5. Cognitive decline, mild to moderate.  6. Klebsiella urinary tract infection.  7. Acute renal failure.  8. Hypokalemia.  9. Hypernatremia.  10. Elevated liver function tests, likely underlying alcoholic liver disease. 11. Hypertension. 12. Left eye conjunctivitis.  DISCHARGE HOME MEDICATIONS: 1. Thiamine 100 mg p.o. daily.  2. Folic acid 1 mg p.o. daily.  3. Tobrex 0.3% ophthalmic drops to left eye every six hours for four more days.  4. Keppra 750 mg p.o. twice a day. 5. Norvasc 5 mg p.o. daily.  6. Ciprofloxacin 500 mg p.o. twice a day for two more days.  7. Ativan 0.5 to 1 mg p.o. every 8 hours p.r.n. for anxiety.   DISCHARGE DIET: Low-sodium diet.   DISCHARGE ACTIVITY: As tolerated.   FOLLOWUP INSTRUCTIONS:  1. Primary care physician follow-up in two weeks.  2. Check a basic metabolic panel in one week.  3. Physical therapy.   LABS/IMAGING STUDIES: Prior to discharge sodium was 146, potassium 4.0, chloride 116, bicarbonate 20, BUN 8, creatinine 0.8, glucose 82, and calcium 8.4.   WBC 7.5, hemoglobin 14.9, hematocrit 44.2, platelet count 196.   ALT 86, AST 86, alkaline phosphatase 73, total bilirubin 2.3, albumin 4.5, and calcium was 10.9 on admission.   Blood cultures: One set was Coag-negative Staphylococcus. One set likely contamination and the other set is negative.   Urinalysis is positive for any infection.   Urine culture is growing Klebsiella  pneumonia, greater than 100,000 colonies, sensitive to Levaquin and Cipro.   Chest x-ray on admission showed chest radiograph without evidence of acute cardiopulmonary disease.  CT of the head without contrast showed no acute intracranial process, generalized cerebral atrophy, and periventricular white matter changes are present. Cerebrovascular atherosclerotic calcifications are also present.  LFTs have improved to ALT being 57, AST 52, alkaline phosphatase 50, total bilirubin 1.4, and albumin 3.2.  BRIEF HOSPITAL COURSE: Mr. Staples is a 74 year old African American male with past medical history significant for history of seizure disorder whether primary seizure disorder or withdrawal seizure is not known and chronic alcohol abuse who was brought in after he was found unresponsive on the floor. According to the patient's sister, he does have history of alcohol abuse and does drink a lot of beer. When she went to clean his apartment yesterday, she found about 200 empty beer cans. The patient was not seen by any neighbors going outside or moving in the house for five days prior to being found unresponsive. So not sure if he had a seizure and was found to be in postictal confused state or had any other cardiovascular or cerebrovascular  event was not known.   1. Altered mental status likely metabolic encephalopathy. The patient has had periods of agitation and confusion while in the hospital likely thought to be secondary to withdrawal symptoms. His metabolic encephalopathy could have been partly because of alcohol withdrawal and also he was found to have severe electrolyte abnormalities on admission with hypercalcemia and hypernatremia with sodium greater than 160, hypokalemia, etc. With fluids,  withdrawal protocol, and correction of electrolytes his mental status is back to baseline. He does have mild cognition abnormalities and CT of the head shows generalized atrophy. The patient's family denies any  prior dementia, but talking to them he has been having issues with alcohol abuse needing assistance with daily activities from the family. He was on seizure medication, Keppra twice a day, prior to coming to the hospital, which will be continued, and he can continue folic acid and thiamine. He was counseled against alcohol use.  2. Klebsiella urinary tract infection. He was placed on Cipro in the hospital. He can take it for two more days to finish a five day course.  3. Acute renal failure with electrolyte abnormalities including hypokalemia and hypernatremia. This has been resolved and close to baseline. Encouraged to take increased p.o. water and can have a basic metabolic panel check in the next week.  4. 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 Again advised against alcohol drinking.   5. Left eye conjunctivitis. He was started Tobrex ophthalmic drops with significant improvement. Continue for four more days.  6. Hypertension. Continue Norvasc.   His course has been otherwise uneventful in the hospital.   DISCHARGE CONDITION: Stable.   DISCHARGE DISPOSITION: Short-term rehabilitation, as recommended by Physical Therapy.         CODE STATUS: FULL CODE.   TIME SPENT ON DISCHARGE: 45 minutes.  ____________________________ Enid Baasadhika Kyerra Vargo, MD rk:slb D: 02/09/2012 15:20:35 ET T: 02/09/2012 15:46:00 ET JOB#: [id number]  cc: Enid Baasadhika Margherita Collyer, MD, <Dictator> [facility] Enid BaasADHIKA Hattie Pine MD ELECTRONICALLY SIGNED 02/11/2012 0:08

## 2016-04-15 ENCOUNTER — Emergency Department: Payer: Medicare Other

## 2016-04-15 ENCOUNTER — Inpatient Hospital Stay
Admission: EM | Admit: 2016-04-15 | Discharge: 2016-04-20 | DRG: 871 | Disposition: A | Discharge status: Skilled Nursing Facility | Payer: Medicare Other | Attending: Internal Medicine | Admitting: Internal Medicine

## 2016-04-15 ENCOUNTER — Encounter: Payer: Self-pay | Admitting: Emergency Medicine

## 2016-04-15 DIAGNOSIS — N390 Urinary tract infection, site not specified: Secondary | ICD-10-CM

## 2016-04-15 DIAGNOSIS — I10 Essential (primary) hypertension: Secondary | ICD-10-CM | POA: Diagnosis present

## 2016-04-15 DIAGNOSIS — D649 Anemia, unspecified: Secondary | ICD-10-CM | POA: Diagnosis present

## 2016-04-15 DIAGNOSIS — R0902 Hypoxemia: Secondary | ICD-10-CM | POA: Diagnosis present

## 2016-04-15 DIAGNOSIS — N179 Acute kidney failure, unspecified: Secondary | ICD-10-CM | POA: Diagnosis present

## 2016-04-15 DIAGNOSIS — Z7401 Bed confinement status: Secondary | ICD-10-CM

## 2016-04-15 DIAGNOSIS — I479 Paroxysmal tachycardia, unspecified: Secondary | ICD-10-CM | POA: Diagnosis present

## 2016-04-15 DIAGNOSIS — Y95 Nosocomial condition: Secondary | ICD-10-CM | POA: Diagnosis present

## 2016-04-15 DIAGNOSIS — B370 Candidal stomatitis: Secondary | ICD-10-CM | POA: Diagnosis present

## 2016-04-15 DIAGNOSIS — R4182 Altered mental status, unspecified: Secondary | ICD-10-CM

## 2016-04-15 DIAGNOSIS — G9341 Metabolic encephalopathy: Secondary | ICD-10-CM | POA: Diagnosis present

## 2016-04-15 DIAGNOSIS — R509 Fever, unspecified: Secondary | ICD-10-CM

## 2016-04-15 DIAGNOSIS — I214 Non-ST elevation (NSTEMI) myocardial infarction: Secondary | ICD-10-CM | POA: Diagnosis present

## 2016-04-15 DIAGNOSIS — R41841 Cognitive communication deficit: Secondary | ICD-10-CM | POA: Diagnosis present

## 2016-04-15 DIAGNOSIS — F0390 Unspecified dementia without behavioral disturbance: Secondary | ICD-10-CM | POA: Diagnosis present

## 2016-04-15 DIAGNOSIS — Z79899 Other long term (current) drug therapy: Secondary | ICD-10-CM | POA: Diagnosis not present

## 2016-04-15 DIAGNOSIS — A4189 Other specified sepsis: Secondary | ICD-10-CM | POA: Diagnosis not present

## 2016-04-15 DIAGNOSIS — M6281 Muscle weakness (generalized): Secondary | ICD-10-CM | POA: Diagnosis present

## 2016-04-15 DIAGNOSIS — A419 Sepsis, unspecified organism: Secondary | ICD-10-CM

## 2016-04-15 HISTORY — DX: Localization-related (focal) (partial) symptomatic epilepsy and epileptic syndromes with simple partial seizures, not intractable, without status epilepticus: G40.109

## 2016-04-15 HISTORY — DX: Personal history of other diseases of the musculoskeletal system and connective tissue: Z87.39

## 2016-04-15 HISTORY — DX: Paroxysmal tachycardia, unspecified: I47.9

## 2016-04-15 HISTORY — DX: Unspecified convulsions: R56.9

## 2016-04-15 HISTORY — DX: Acute bronchitis, unspecified: J20.9

## 2016-04-15 HISTORY — DX: Essential (primary) hypertension: I10

## 2016-04-15 HISTORY — DX: Thiamine deficiency, unspecified: E51.9

## 2016-04-15 HISTORY — DX: Anemia, unspecified: D64.9

## 2016-04-15 HISTORY — DX: Muscle weakness (generalized): M62.81

## 2016-04-15 HISTORY — DX: Urinary tract infection, site not specified: N39.0

## 2016-04-15 HISTORY — DX: Reserved for concepts with insufficient information to code with codable children: IMO0002

## 2016-04-15 HISTORY — DX: Vitamin deficiency, unspecified: E56.9

## 2016-04-15 HISTORY — DX: Encounter for change or removal of nonsurgical wound dressing: Z48.00

## 2016-04-15 HISTORY — DX: Acute gastroenteropathy due to Norwalk agent: A08.11

## 2016-04-15 HISTORY — DX: Weakness: R53.1

## 2016-04-15 HISTORY — DX: Vomiting, unspecified: R11.10

## 2016-04-15 LAB — CBC WITH DIFFERENTIAL/PLATELET
BASOS ABS: 0.3 10*3/uL — AB (ref 0–0.1)
Basophils Relative: 1 %
EOS ABS: 0 10*3/uL (ref 0–0.7)
Eosinophils Relative: 0 %
HCT: 40 % (ref 40.0–52.0)
HEMOGLOBIN: 13.4 g/dL (ref 13.0–18.0)
LYMPHS PCT: 1 %
Lymphs Abs: 0.3 10*3/uL — ABNORMAL LOW (ref 1.0–3.6)
MCH: 30.1 pg (ref 26.0–34.0)
MCHC: 33.5 g/dL (ref 32.0–36.0)
MCV: 89.9 fL (ref 80.0–100.0)
Monocytes Absolute: 4.5 10*3/uL — ABNORMAL HIGH (ref 0.2–1.0)
Monocytes Relative: 16 %
NEUTROS PCT: 82 %
Neutro Abs: 23 10*3/uL — ABNORMAL HIGH (ref 1.4–6.5)
Platelets: 174 10*3/uL (ref 150–440)
RBC: 4.45 MIL/uL (ref 4.40–5.90)
RDW: 14.1 % (ref 11.5–14.5)
WBC: 28.1 10*3/uL — AB (ref 3.8–10.6)

## 2016-04-15 LAB — LACTIC ACID, PLASMA: Lactic Acid, Venous: 1.5 mmol/L (ref 0.5–2.0)

## 2016-04-15 LAB — COMPREHENSIVE METABOLIC PANEL
ALK PHOS: 108 U/L (ref 38–126)
ALT: 16 U/L — ABNORMAL LOW (ref 17–63)
ANION GAP: 13 (ref 5–15)
AST: 25 U/L (ref 15–41)
Albumin: 3.4 g/dL — ABNORMAL LOW (ref 3.5–5.0)
BILIRUBIN TOTAL: 1.4 mg/dL — AB (ref 0.3–1.2)
BUN: 22 mg/dL — ABNORMAL HIGH (ref 6–20)
CALCIUM: 8.9 mg/dL (ref 8.9–10.3)
CO2: 23 mmol/L (ref 22–32)
Chloride: 109 mmol/L (ref 101–111)
Creatinine, Ser: 1.95 mg/dL — ABNORMAL HIGH (ref 0.61–1.24)
GFR, EST AFRICAN AMERICAN: 38 mL/min — AB (ref >60)
GFR, EST NON AFRICAN AMERICAN: 32 mL/min — AB (ref >60)
Glucose, Bld: 181 mg/dL — ABNORMAL HIGH (ref 65–99)
Potassium: 3.5 mmol/L (ref 3.5–5.1)
SODIUM: 145 mmol/L (ref 135–145)
TOTAL PROTEIN: 8.8 g/dL — AB (ref 6.5–8.1)

## 2016-04-15 LAB — URINALYSIS COMPLETE WITH MICROSCOPIC (ARMC ONLY)
GLUCOSE, UA: NEGATIVE mg/dL
NITRITE: NEGATIVE
Protein, ur: 100 mg/dL — AB
SPECIFIC GRAVITY, URINE: 1.029 (ref 1.005–1.030)
Trans Epithel, UA: 2
pH: 5 (ref 5.0–8.0)

## 2016-04-15 LAB — LIPASE, BLOOD: LIPASE: 19 U/L (ref 11–51)

## 2016-04-15 LAB — TROPONIN I
Troponin I: 0.14 ng/mL — ABNORMAL HIGH (ref <0.031)
Troponin I: 1.38 ng/mL — ABNORMAL HIGH (ref <0.031)
Troponin I: 4.16 ng/mL — ABNORMAL HIGH (ref <0.031)

## 2016-04-15 LAB — PROTIME-INR
INR: 1.26
Prothrombin Time: 15.9 seconds — ABNORMAL HIGH (ref 11.4–15.0)

## 2016-04-15 LAB — MRSA PCR SCREENING: MRSA BY PCR: NEGATIVE

## 2016-04-15 MED ORDER — ACETAMINOPHEN ER 650 MG PO TBCR: 650.0000 mg | EXTENDED_RELEASE_TABLET | Freq: Two times a day (BID) | ORAL | Status: DC

## 2016-04-15 MED ORDER — TRAMADOL HCL 50 MG PO TABS: 50.0000 mg | ORAL_TABLET | Freq: Three times a day (TID) | ORAL | Status: DC | PRN

## 2016-04-15 MED ORDER — SODIUM CHLORIDE 0.9 % IV BOLUS (SEPSIS)
1000.0000 mL | Freq: Once | INTRAVENOUS | Status: AC
  Administered 2016-04-15: 1000 mL via INTRAVENOUS

## 2016-04-15 MED ORDER — IBUPROFEN 400 MG PO TABS
400.0000 mg | ORAL_TABLET | Freq: Once | ORAL | Status: AC
Start: 2016-04-15 — End: 2016-04-15
  Administered 2016-04-15: 400 mg via ORAL
  Filled 2016-04-15: qty 1

## 2016-04-15 MED ORDER — HEPARIN (PORCINE) IN NACL 100-0.45 UNIT/ML-% IJ SOLN
1800.0000 [IU]/h | INTRAMUSCULAR | Status: DC
  Administered 2016-04-15: 18:00:00 1200 [IU]/h via INTRAVENOUS
  Administered 2016-04-16: 06:00:00 1350 [IU]/h via INTRAVENOUS
  Administered 2016-04-17: 1800 [IU]/h via INTRAVENOUS
  Filled 2016-04-15 (×7): qty 250

## 2016-04-15 MED ORDER — ENOXAPARIN SODIUM 40 MG/0.4ML ~~LOC~~ SOLN: 40.0000 mg | SUBCUTANEOUS | Status: DC

## 2016-04-15 MED ORDER — GABAPENTIN 100 MG PO CAPS
200.0000 mg | ORAL_CAPSULE | Freq: Two times a day (BID) | ORAL | Status: DC
  Administered 2016-04-15 – 2016-04-20 (×10): 200 mg via ORAL
  Filled 2016-04-15 (×10): qty 2

## 2016-04-15 MED ORDER — HYDROCODONE-ACETAMINOPHEN 5-325 MG PO TABS: 1.0000 | ORAL_TABLET | ORAL | Status: DC | PRN

## 2016-04-15 MED ORDER — SODIUM CHLORIDE 0.9% FLUSH
3.0000 mL | Freq: Two times a day (BID) | INTRAVENOUS | Status: DC
  Administered 2016-04-15 – 2016-04-20 (×11): 3 mL via INTRAVENOUS

## 2016-04-15 MED ORDER — ONDANSETRON HCL 4 MG PO TABS: 4.0000 mg | ORAL_TABLET | Freq: Four times a day (QID) | ORAL | Status: DC | PRN

## 2016-04-15 MED ORDER — ONDANSETRON HCL 4 MG/2ML IJ SOLN: 4.0000 mg | Freq: Four times a day (QID) | INTRAMUSCULAR | Status: DC | PRN

## 2016-04-15 MED ORDER — VANCOMYCIN HCL IN DEXTROSE 1-5 GM/200ML-% IV SOLN
1000.0000 mg | Freq: Once | INTRAVENOUS | Status: AC
  Administered 2016-04-15: 1000 mg via INTRAVENOUS
  Filled 2016-04-15: qty 200

## 2016-04-15 MED ORDER — ADULT MULTIVITAMIN W/MINERALS CH
1.0000 | ORAL_TABLET | Freq: Every day | ORAL | Status: DC
  Administered 2016-04-16 – 2016-04-20 (×5): 1 via ORAL
  Filled 2016-04-15 (×5): qty 1

## 2016-04-15 MED ORDER — PIPERACILLIN-TAZOBACTAM 3.375 G IVPB 30 MIN
3.3750 g | Freq: Once | INTRAVENOUS | Status: AC
  Administered 2016-04-15: 3.375 g via INTRAVENOUS
  Filled 2016-04-15: qty 50

## 2016-04-15 MED ORDER — SODIUM CHLORIDE 0.9 % IV SOLN
1.0000 g | Freq: Two times a day (BID) | INTRAVENOUS | Status: DC
  Administered 2016-04-15 (×2): 1 g via INTRAVENOUS
  Filled 2016-04-15 (×6): qty 1

## 2016-04-15 MED ORDER — ACETAMINOPHEN 325 MG PO TABS
650.0000 mg | ORAL_TABLET | Freq: Four times a day (QID) | ORAL | Status: DC | PRN
Start: 2016-04-15 — End: 2016-04-20
  Administered 2016-04-15: 650 mg via ORAL
  Filled 2016-04-15: qty 2

## 2016-04-15 MED ORDER — SODIUM CHLORIDE 0.9 % IV BOLUS (SEPSIS)
1000.0000 mL | Freq: Once | INTRAVENOUS | Status: AC
Start: 2016-04-15 — End: 2016-04-15
  Administered 2016-04-15: 1000 mL via INTRAVENOUS

## 2016-04-15 MED ORDER — ACETAMINOPHEN 650 MG RE SUPP
650.0000 mg | Freq: Four times a day (QID) | RECTAL | Status: DC | PRN
Start: 2016-04-15 — End: 2016-04-20

## 2016-04-15 MED ORDER — SENNOSIDES-DOCUSATE SODIUM 8.6-50 MG PO TABS
1.0000 | ORAL_TABLET | Freq: Every evening | ORAL | Status: DC | PRN
Start: 2016-04-15 — End: 2016-04-20

## 2016-04-15 MED ORDER — AMLODIPINE BESYLATE 10 MG PO TABS
10.0000 mg | ORAL_TABLET | Freq: Every day | ORAL | Status: DC
Start: 2016-04-15 — End: 2016-04-17
  Administered 2016-04-16 – 2016-04-17 (×2): 10 mg via ORAL
  Filled 2016-04-15 (×2): qty 1

## 2016-04-15 MED ORDER — HEPARIN BOLUS VIA INFUSION
4000.0000 [IU] | Freq: Once | INTRAVENOUS | Status: AC
  Administered 2016-04-15: 4000 [IU] via INTRAVENOUS
  Filled 2016-04-15: qty 4000

## 2016-04-15 NOTE — ED Provider Notes (Signed)
[facility] Emergency Department Provider Note  Time seen: 10:05 AM  I have reviewed the triage vital signs and the nursing notes.   HISTORY  Chief Complaint No chief complaint on file.    HPI Nathan NeighboursRalph L Hartel is a 75 y.o. male from a nursing home with a past medical history of cognitive impairment, generalized muscle weakness largely bedbound, paroxysmal tachycardia, presents to the emergency department with altered mental status as a code sepsis. According to EMS report the patient lives at a nursing facility, is bedbound for the past several years due to generalized weakness and muscle weakness. Patient can communicate somewhat at baseline, but per EMS staff noticed a change in his ability to communicate. He was febrile and very diaphoretic so they called EMS. EMS noted the patient to have an oxygen saturation of 88% on room air, no baseline oxygen requirement. Crackles in bilateral lung fields. Febrile to 102 on arrival. Upon arrival to the emergency department patient is pale, extremely diaphoretic, does not complain of any acute abnormalities other his ability to communicate is greatly limited.Patient is awake, alert, we'll make eye contact, no obvious distress. Occasionally will get angry such as during IV attempts, will occasionally yell at us.     No past medical history on file.  There are no active problems to display for this patient.   No past surgical history on file.  No current outpatient prescriptions on file.  Allergies Review of patient's allergies indicates not on file.  No family history on file.  Social History Social History  Substance Use Topics  . Smoking status: Not on file  . Smokeless tobacco: Not on file  . Alcohol Use: Not on file    Review of Systems Unable to obtain an adequate review of systems due to altered mental status and baseline cognitive impairment.  ____________________________________________   PHYSICAL  EXAM:  Constitutional: Alert, we'll make eye contact, does not answer yes no questioning or follow commands, but will yell and curse at us with any painful stimuli. No acute distress. Eyes: Normal exam ENT   Head: Normocephalic and atraumatic   Mouth/Throat: Dry mucous membranes Cardiovascular: Regular rhythm, rate around 130 bpm. Respiratory: No accessory muscle use, mild tachypnea, bilateral rales/crackles, no wheeze. Gastrointestinal: Soft and nontender. No reaction to abdominal palpation. No distention. Musculoskeletal: No lower extremity tenderness or edema noted. Neurologic:  Per report patient has cognitive impairment and baseline but does have some communication skills, per EMS report they've noticed a decline in his ability to communicate today. During my evaluation the patient will look at you, make eye contact, appears alert, but will not answer yes/no questions, will not follow commands. Does become angry and yells/curses at staff at times. Skin:  Skin is warm, quite diaphoretic. Psychiatric: Staff reports decreased responsiveness although the patient does become quite angry at times yelling at staff during any type of painful stimuli.  ____________________________________________    EKG  EKG reviewed and interpreted by myself shows sinus tachycardia 123 bpm, narrow QRS, normal axis, slightly prolonged QTC of 504 ms, nonspecific ST changes. No obvious ST elevations or depressions.  ____________________________________________    INITIAL IMPRESSION / ASSESSMENT AND PLAN / ED COURSE  Pertinent labs & imaging results that were available during my care of the patient were reviewed by me and considered in my medical decision making (see chart for details).  Patient presents the emergency department febrile, tachycardic and hypoxic. Staff notes. EMS report decreased mental status over the past 24  hours as well. Patient meets sepsis criteria, a code sepsis has been  initiated. Highly suspect pneumonia to be the cause of the patient's hypoxia given bilateral crackles and fever. We will begin empiric anabiotic's, IV fluids, and obtain lab/cultures.  ----------------------------------------- 10:44 AM on 04/15/2016 -----------------------------------------  Currently awaiting the majority of the lab work to resolve however the patient's troponin has resulted at 0.14, mild creatinine elevation 1.95, ABG shows an arterial PO2 of 76 on 6 L nasal cannula. Patient continues to desat even on 6 L, given his crackles we will proceed with BiPAP.   ----------------------------------------- 11:40 AM on 04/15/2016 -----------------------------------------  Patient has not done better with nasal cannula, currently on 6 L satting 94%. Labs show a urinalysis consistent with urinary tract infection. We have also placed a Foley catheter. Patient has received empiric antibiotics, we will continue treatment aimed more towards a urinary tract infection. Cultures have been sent, patient receiving IV fluids. Blood pressure remains on the lower and around 98-110 systolic. Patient will be admitted to the hospital for further treatment.   CRITICAL CARE Performed by: Minna Antis   Total critical care time: 60 minutes  Critical care time was exclusive of separately billable procedures and treating other patients.  Critical care was necessary to treat or prevent imminent or life-threatening deterioration.  Critical care was time spent personally by me on the following activities: development of treatment plan with patient and/or surrogate as well as nursing, discussions with consultants, evaluation of patient's response to treatment, examination of patient, obtaining history from patient or surrogate, ordering and performing treatments and interventions, ordering and review of laboratory studies, ordering and review of radiographic studies, pulse oximetry and re-evaluation of  patient's condition.   ____________________________________________   FINAL CLINICAL IMPRESSION(S) / ED DIAGNOSES  Sepsis Urinary tract infection  Minna Antis, MD 04/15/16 1141

## 2016-04-15 NOTE — Consult Note (Signed)
Pharmacy Antibiotic Note  Nathan NeighboursRalph L Caldera is a 75 y.o. male admitted on 04/15/2016 with pneumonia.  Pharmacy has been consulted for meropenem dosing.  Plan: Will initiate meropenem 1g IV q12hrs due to estimated CrCl of ~48 ml/min.  Weight: 222 lb 10.6 oz (101 kg)  Temp (24hrs), Avg:98.9 F (37.2 C), Min:97.7 F (36.5 C), Max:102 F (38.9 C)   Recent Labs Lab 04/15/16 1007  WBC 28.1*  CREATININE 1.95*  LATICACIDVEN 1.5    CrCl cannot be calculated (Unknown ideal weight.).    No Known Allergies  Antimicrobials this admission: Meropenem 5/27 >>  Vancomycin and Zosyn x1 in ED   Microbiology results: 5/27 BCx x2: pending 5/27 UCx: pending  Thank you for allowing pharmacy to be a part of this patient's care.  Roque CashAllison Francisco Ostrovsky, PharmD Pharmacy Resident 04/15/2016

## 2016-04-15 NOTE — Progress Notes (Signed)
ANTICOAGULATION CONSULT NOTE - Initial Consult  Pharmacy Consult for Heparin  Indication: chest pain/ACS  No Known Allergies  Patient Measurements: Height: 5\' 11"  (180.3 cm) Weight: 207 lb 9.6 oz (94.167 kg) IBW/kg (Calculated) : 75.3 Heparin Dosing Weight: 94  Vital Signs: Temp: 97.7 F (36.5 C) (05/27 1405) Temp Source: Oral (05/27 1405) BP: 123/77 mmHg (05/27 1405) Pulse Rate: 111 (05/27 1405)  Labs:  Recent Labs  04/15/16 1007 04/15/16 1553  HGB 13.4  --   HCT 40.0  --   PLT 174  --   LABPROT 15.9*  --   INR 1.26  --   CREATININE 1.95*  --   TROPONINI 0.14* 1.38*    Estimated Creatinine Clearance: 39.6 mL/min (by C-G formula based on Cr of 1.95).   Medical History: Past Medical History  Diagnosis Date  . Hypertension   . History of rhabdomyolysis   . Weakness   . Vitamin disease   . Muscle weakness (generalized)   . Special sensory attacks (HCC)   . Urinary tract infection, site not specified   . Anemia, unspecified   . Thiamin deficiency   . Acute bronchitis   . Epidemic vomiting syndrome   . Change of dressing   . Paroxysmal tachycardia, unspecified (HCC)     Medications:  Prescriptions prior to admission  Medication Sig Dispense Refill Last Dose  . acetaminophen (TYLENOL) 650 MG CR tablet Take 650 mg by mouth 2 (two) times daily.   unknown  . amLODipine (NORVASC) 10 MG tablet Take 10 mg by mouth daily.   unknown  . gabapentin (NEURONTIN) 100 MG capsule Take 200 mg by mouth 2 (two) times daily.   unknown  . Multiple Vitamin (MULTIVITAMIN) tablet Take 1 tablet by mouth daily.   unknown  . traMADol (ULTRAM) 50 MG tablet Take 50 mg by mouth 3 (three) times daily as needed for moderate pain or severe pain.   unknown   Scheduled:  . amLODipine  10 mg Oral Daily  . gabapentin  200 mg Oral BID  . heparin  4,000 Units Intravenous Once  . meropenem (MERREM) IV  1 g Intravenous Q12H  . multivitamin with minerals  1 tablet Oral Daily  . sodium  chloride flush  3 mL Intravenous Q12H    Assessment: Pharmacy consulted to dose and monitor heparin in this 75 year old male for ACS.   Goal of Therapy:  Heparin level 0.3-0.7 units/ml Monitor platelets by anticoagulation protocol: Yes   Plan:  Give 4000 units bolus x 1 Start heparin infusion at 1200 units/hr Check anti-Xa level in 8 hours and daily while on heparin Continue to monitor H&H and platelets  Rylann Munford D 04/15/2016,5:40 PM

## 2016-04-15 NOTE — ED Notes (Addendum)
[facility] as a Code Sepsis. Patient from [facility]. Staff found patient this morning diaphoretic,febrile, and tachycardic. EMS reports nightshift did not report alterations in patient behavior or status however morning shift noticed a deviation from baseline and activated 911

## 2016-04-15 NOTE — H&P (Addendum)
[facility] -  at [facility]   PATIENT NAME: Nathan Figueroa    MR#:  [id number]  DATE OF BIRTH:  12/27/1940  DATE OF ADMISSION:  04/15/2016  PRIMARY CARE PHYSICIAN: Dorothey BasemanBronstein, David   REQUESTING/REFERRING PHYSICIAN: Dr Lenard LancePaduchowski  CHIEF COMPLAINT:  Altered mental status  HISTORY OF PRESENT ILLNESS:  Nathan Figueroa  is a 75 y.o. male with a known history of Cognitive impairment with cognitive communication deficit who is predominantly bedbound and essential hypertension who presents from nursing facility due to altered mental status. Code sepsis was called in the emergency room. Patient received broad-spectrum antibiotics for fever and diaphoresis. Initially he was noted to have oxygen saturation of 88% on room air. He had a fever of 102 upon arrival. Patient is awake but does not talk he follows commands.  PAST MEDICAL HISTORY:   Past Medical History  Diagnosis Date  . Hypertension   . History of rhabdomyolysis   . Weakness   . Vitamin disease   . Muscle weakness (generalized)   . Special sensory attacks (HCC)   . Urinary tract infection, site not specified   . Anemia, unspecified   . Thiamin deficiency   . Acute bronchitis   . Epidemic vomiting syndrome   . Change of dressing   . Paroxysmal tachycardia, unspecified (HCC)     PAST SURGICAL HISTORY:  None  SOCIAL HISTORY:   Social History  Substance Use Topics  . Smoking status: Never Smoker   . Smokeless tobacco: Not on file  . Alcohol Use: Not on file    FAMILY HISTORY:  Unable to obtain due to patient's communication deficit  DRUG ALLERGIES:  No Known Allergies  REVIEW OF SYSTEMS:   Review of Systems  Unable to perform ROS: medical condition    MEDICATIONS AT HOME:   Prior to Admission medications   Medication Sig Start Date End Date Taking? Authorizing Provider  acetaminophen (TYLENOL) 650 MG CR tablet Take 650 mg by mouth 2 (two) times daily.   Yes Historical Provider, MD   amLODipine (NORVASC) 10 MG tablet Take 10 mg by mouth daily.   Yes Historical Provider, MD  gabapentin (NEURONTIN) 100 MG capsule Take 200 mg by mouth 2 (two) times daily.   Yes Historical Provider, MD  Multiple Vitamin (MULTIVITAMIN) tablet Take 1 tablet by mouth daily.   Yes Historical Provider, MD  traMADol (ULTRAM) 50 MG tablet Take 50 mg by mouth 3 (three) times daily as needed for moderate pain or severe pain.   Yes Historical Provider, MD      VITAL SIGNS:  Blood pressure 104/82, pulse 92, temperature 98.8 F (37.1 C), temperature source Core (Comment), resp. rate 25, weight 101 kg (222 lb 10.6 oz), SpO2 94 %.  PHYSICAL EXAMINATION:   Physical Exam  Constitutional: He is well-developed, well-nourished, and in no distress. No distress.  HENT:  Head: Normocephalic.  Eyes: No scleral icterus.  Neck: Neck supple. No JVD present. No tracheal deviation present.  Cardiovascular: Normal rate, regular rhythm and normal heart sounds.  Exam reveals no gallop and no friction rub.   No murmur heard. Pulmonary/Chest: Effort normal and breath sounds normal. No respiratory distress. He has no wheezes. He has no rales. He exhibits no tenderness.  Abdominal: Soft. Bowel sounds are normal. He exhibits no distension and no mass. There is no tenderness. There is no rebound and no guarding.  Musculoskeletal: He exhibits no edema.  Upper extremity strength 3 out of 5 lower extremity 1 out of  5 bilaterally  Neurological: He is alert.  Skin: Skin is warm. No rash noted. No erythema.      LABORATORY PANEL:   CBC  Recent Labs Lab 04/15/16 1007  WBC 28.1*  HGB 13.4  HCT 40.0  PLT 174   ------------------------------------------------------------------------------------------------------------------  Chemistries   Recent Labs Lab 04/15/16 1007  NA 145  K 3.5  CL 109  CO2 23  GLUCOSE 181*  BUN 22*  CREATININE 1.95*  CALCIUM 8.9  AST 25  ALT 16*  ALKPHOS 108  BILITOT 1.4*    ------------------------------------------------------------------------------------------------------------------  Cardiac Enzymes  Recent Labs Lab 04/15/16 1007  TROPONINI 0.14*   ------------------------------------------------------------------------------------------------------------------  RADIOLOGY:  Dg Chest Port 1 View  04/15/2016  CLINICAL DATA:  Sepsis.  Diaphoresis.  Fever.  Tachycardia. EXAM: PORTABLE CHEST 1 VIEW COMPARISON:  09/01/2012. FINDINGS: Poor inspiration. Interval mild enlargement of the cardiac silhouette. Interval mild prominence of the interstitial markings. Progressive elevation of the right hemidiaphragm. No airspace consolidation seen. Diffuse osteopenia. Right hilar surgical clips are again demonstrated. IMPRESSION: 1. Interval mild cardiomegaly and mild interstitial lung disease with an appearance suggesting chronic interstitial lung disease. 2. No evidence of pneumonia on a single view. Electronically Signed   By: Beckie Salts M.D.   On: 04/15/2016 10:35    EKG:   Sinus tachycardia QTC of no ST elevation or depression  IMPRESSION AND PLAN:   75 year old male who has cognitive communication deficit and bedbound at baseline presents with altered mental status due to sepsis from urinary tract infection and HCAP.  1. Sepsis with metabolic encephalopathy: Patient presents with fever, tachycardia and leukocytosis. This is due to urinary tract infection and HCAP. Follow up on urine and blood cultures. Continue vancomycin and Zosyn and Rocephin. If MRSA screening is negative then discontinue vancomycin. There is no evidence of pneumonia on chest x-ray.  2. Urinary tract infection: Follow up on urine culture. Continue Rocephin  3. Essential hypertension: Blood pressure is acceptable at this point. Continue Norvasc.  4. Cognitive communication deficit: Patient appears now to be at baseline.  5. History of paroxysmal tachycardia: Continue  telemetry monitoring.   6.  Elevated troponin: This is due to sepsis most likely with demand ischemia. Continue to trend cardiac enzymes.   All the records are reviewed and case discussed with ED provider.  CODE STATUS: FULL  TOTAL TIME TAKING CARE OF THIS PATIENT: 50 minutes.    Caeleb Batalla M.D on 04/15/2016 at 11:45 AM  Between 7am to 6pm - Pager - [phone]  After 6pm go to [email or web address] - Social research officer, government  [facility]  Office  [phone]  CC: Primary care physician; No PCP Per Patient

## 2016-04-15 NOTE — Clinical Social Work Note (Signed)
Clinical Social Work Assessment  Patient Details  Name: Nathan Figueroa MRN: [id number] Date of Birth: Apr 20, 1941  Date of referral:  04/15/16               Reason for consult:  Facility Placement/Can return to [facility]                Permission sought to share information with:  Family Supports, Customer service manager Permission granted to share information::  Yes, Verbal Permission Granted  Name::     Nathan Figueroa/sister [phone]  Agency::  [facility]  Relationship::  yes  Contact Information:  yes  Housing/Transportation Living arrangements for the past 2 months:  [place] of Information:  Patient/[facility] Patient Interpreter Needed:  None Criminal Activity/Legal Involvement Pertinent to Current Situation/Hospitalization:  No - Comment as needed Significant Relationships:  Siblings Lives with:  Facility Resident Do you feel safe going back to the place where you live?  Yes Need for family participation in patient care:  Yes (Comment)  Care giving concerns: None- patient reports he is not hungry   Facilities manager / plan:  LCSW met with patient he gave`verbal consent to speak to his sister Nathan Figueroa and [employer]. LCSW called patient has been a  LT resident since [facility] at [facility] and can return. 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 He is failing to thrive, refusing to eat sometimes according to his nurse at [facility]. He is not usually on 02 but she reported he dipped to 80 today LCSW called his Nathan Figueroa and unable to reach [phone]. Patient does complain of chronic pain issues in his limbs. [facility] nurse states he is oriented to person,place and situation just struggles with speech and really not motivated to do anything at [employer] ( does not want to participate in recreation) He requires full assistance with all his ADL's, he is on a soft mechanical  food diet. Left eye clouded but he reports he can see. Hearing is good. Insurance medicare/medicaid                                                                                                                                         Employment status:  Retired Forensic scientist:  Information systems manager, Medicaid In [place] PT Recommendations:  [place], Not assessed at this time Information / Referral to community resources:  [place]  Patient/Family's Response to care:  No comment , just stated to get better  Patient/Family's Understanding of and Emotional Response to Diagnosis, Current Treatment, and Prognosis: TBD- Patient stated he is here because of this ( pointed to his legs)( breathing)  Emotional Assessment Appearance:  Appears stated age Attitude/Demeanor/Rapport:   (Polite/calm) Affect (typically observed):  Accepting, Unable to Assess (patient has a speech inpediment, when he talks slowly he is understandable) Orientation:  Oriented to Self Alcohol / Substance use:  Other (Past use) Psych involvement (Current and /or in the community):  No (Comment)  Discharge Needs  Concerns to be addressed:  Care Coordination Readmission within the last 30 days:  No Current discharge risk:  None Barriers to Discharge:  No Barriers Identified   Joana Reamer, LCSW 04/15/2016, 2:59 PM

## 2016-04-15 NOTE — NC FL2 (Signed)
[place] MEDICAID FL2 LEVEL OF CARE SCREENING TOOL     IDENTIFICATION  Patient Name: Nathan Figueroa Birthdate: 05-22-1941 Sex: male Admission Date (Current Location): 04/15/2016  [place] and [place] Number:  Chiropodist and Address:  [facility], [address], [place], [place] [address]      Provider Number: [id number]  Attending Physician Name and Address:  Adrian Saran, MD  Relative Name and Phone Number:       Current Level of Care: Hospital Recommended Level of Care: Skilled Nursing Facility Prior Approval Number:    Date Approved/Denied:   PASRR Number:   [id number]   Discharge Plan: SNF    Current Diagnoses: Patient Active Problem List   Diagnosis Date Noted  . Sepsis (HCC) 04/15/2016    Orientation RESPIRATION BLADDER Height & Weight     Self, Time, Situation, Place  O2 Incontinent Weight: 222 lb 10.6 oz (101 kg) Height:     BEHAVIORAL SYMPTOMS/MOOD NEUROLOGICAL BOWEL NUTRITION STATUS      Incontinent Diet (Mechanical/soft food)  AMBULATORY STATUS COMMUNICATION OF NEEDS Skin   Extensive Assist Verbally (He struggles with speech but answers slowly) Normal                       Personal Care Assistance Level of Assistance  Bathing, Feeding, Dressing, Total care Bathing Assistance: Maximum assistance Feeding assistance: Limited assistance Dressing Assistance: Maximum assistance Total Care Assistance: Maximum assistance   Functional Limitations Info  Sight, Hearing, Speech Sight Info: Impaired Hearing Info: Adequate Speech Info: Impaired    SPECIAL CARE FACTORS FREQUENCY  PT (By licensed PT)     PT Frequency: 5x              Contractures      Additional Factors Info  Code Status Code Status Info: Full code             Current Medications (04/15/2016):  This is the current hospital active medication list Current Facility-Administered Medications  Medication Dose Route Frequency Provider  Last Rate Last Dose  . acetaminophen (TYLENOL) tablet 650 mg  650 mg Oral Q6H PRN Adrian Saran, MD       Or  . acetaminophen (TYLENOL) suppository 650 mg  650 mg Rectal Q6H PRN Adrian Saran, MD      . amLODipine (NORVASC) tablet 10 mg  10 mg Oral Daily Sital Mody, MD      . enoxaparin (LOVENOX) injection 40 mg  40 mg Subcutaneous Q24H Sital Mody, MD      . gabapentin (NEURONTIN) capsule 200 mg  200 mg Oral BID Adrian Saran, MD      . HYDROcodone-acetaminophen (NORCO/VICODIN) 5-325 MG per tablet 1-2 tablet  1-2 tablet Oral Q4H PRN Adrian Saran, MD      . multivitamin with minerals tablet 1 tablet  1 tablet Oral Daily Sital Mody, MD      . ondansetron (ZOFRAN) tablet 4 mg  4 mg Oral Q6H PRN Adrian Saran, MD       Or  . ondansetron (ZOFRAN) injection 4 mg  4 mg Intravenous Q6H PRN Sital Mody, MD      . senna-docusate (Senokot-S) tablet 1 tablet  1 tablet Oral QHS PRN Sital Mody, MD      . sodium chloride flush (NS) 0.9 % injection 3 mL  3 mL Intravenous Q12H Sital Mody, MD      . traMADol (ULTRAM) tablet 50 mg  50 mg Oral TID PRN Sital  Juliene PinaMody, MD         Discharge Medications: Please see discharge summary for a list of discharge medications.  Relevant Imaging Results:  Relevant Lab Results:   Additional Information SSN # [id number]  Cheron SchaumannBandi, Davyn Morandi M, [place]

## 2016-04-16 ENCOUNTER — Inpatient Hospital Stay
Admit: 2016-04-16 | Discharge: 2016-04-16 | Disposition: A | Discharge status: Home / Self Care | Payer: Medicare Other | Attending: Internal Medicine | Admitting: Internal Medicine

## 2016-04-16 LAB — BLOOD CULTURE ID PANEL (REFLEXED)
Acinetobacter baumannii: NOT DETECTED
CANDIDA GLABRATA: NOT DETECTED
CANDIDA KRUSEI: NOT DETECTED
CANDIDA PARAPSILOSIS: NOT DETECTED
CARBAPENEM RESISTANCE: NOT DETECTED
Candida albicans: NOT DETECTED
Candida tropicalis: NOT DETECTED
ESCHERICHIA COLI: NOT DETECTED
Enterobacter cloacae complex: NOT DETECTED
Enterobacteriaceae species: NOT DETECTED
Enterococcus species: NOT DETECTED
Haemophilus influenzae: NOT DETECTED
KLEBSIELLA OXYTOCA: NOT DETECTED
KLEBSIELLA PNEUMONIAE: NOT DETECTED
LISTERIA MONOCYTOGENES: NOT DETECTED
Methicillin resistance: DETECTED — AB
NEISSERIA MENINGITIDIS: NOT DETECTED
Proteus species: NOT DETECTED
Pseudomonas aeruginosa: NOT DETECTED
SERRATIA MARCESCENS: NOT DETECTED
STAPHYLOCOCCUS SPECIES: DETECTED — AB
STREPTOCOCCUS AGALACTIAE: NOT DETECTED
STREPTOCOCCUS SPECIES: NOT DETECTED
Staphylococcus aureus (BCID): NOT DETECTED
Streptococcus pneumoniae: NOT DETECTED
Streptococcus pyogenes: NOT DETECTED
Vancomycin resistance: NOT DETECTED

## 2016-04-16 LAB — CBC
HCT: 36 % — ABNORMAL LOW (ref 40.0–52.0)
Hemoglobin: 11.8 g/dL — ABNORMAL LOW (ref 13.0–18.0)
MCH: 29.8 pg (ref 26.0–34.0)
MCHC: 32.9 g/dL (ref 32.0–36.0)
MCV: 90.6 fL (ref 80.0–100.0)
PLATELETS: 137 10*3/uL — AB (ref 150–440)
RBC: 3.98 MIL/uL — AB (ref 4.40–5.90)
RDW: 14.3 % (ref 11.5–14.5)
WBC: 18.4 10*3/uL — AB (ref 3.8–10.6)

## 2016-04-16 LAB — BASIC METABOLIC PANEL
Anion gap: 9 (ref 5–15)
BUN: 19 mg/dL (ref 6–20)
CHLORIDE: 113 mmol/L — AB (ref 101–111)
CO2: 23 mmol/L (ref 22–32)
CREATININE: 1.81 mg/dL — AB (ref 0.61–1.24)
Calcium: 8 mg/dL — ABNORMAL LOW (ref 8.9–10.3)
GFR calc non Af Amer: 35 mL/min — ABNORMAL LOW (ref >60)
GFR, EST AFRICAN AMERICAN: 41 mL/min — AB (ref >60)
GLUCOSE: 132 mg/dL — AB (ref 65–99)
Potassium: 3.6 mmol/L (ref 3.5–5.1)
Sodium: 145 mmol/L (ref 135–145)

## 2016-04-16 LAB — TROPONIN I: Troponin I: 7.92 ng/mL — ABNORMAL HIGH (ref <0.031)

## 2016-04-16 LAB — URINE CULTURE

## 2016-04-16 LAB — HEPARIN LEVEL (UNFRACTIONATED)
HEPARIN UNFRACTIONATED: 0.18 [IU]/mL — AB (ref 0.30–0.70)
Heparin Unfractionated: 0.26 IU/mL — ABNORMAL LOW (ref 0.30–0.70)
Heparin Unfractionated: 0.29 IU/mL — ABNORMAL LOW (ref 0.30–0.70)

## 2016-04-16 LAB — ECHOCARDIOGRAM COMPLETE
HEIGHTINCHES: 71 in
Weight: 3321.6 oz

## 2016-04-16 MED ORDER — HEPARIN BOLUS VIA INFUSION
1400.0000 [IU] | Freq: Once | INTRAVENOUS | Status: AC
  Administered 2016-04-16: 14:00:00 1400 [IU] via INTRAVENOUS
  Filled 2016-04-16: qty 1400

## 2016-04-16 MED ORDER — VANCOMYCIN HCL IN DEXTROSE 1-5 GM/200ML-% IV SOLN
1000.0000 mg | INTRAVENOUS | Status: DC
  Filled 2016-04-16 (×2): qty 200

## 2016-04-16 MED ORDER — HEPARIN BOLUS VIA INFUSION
1400.0000 [IU] | Freq: Once | INTRAVENOUS | Status: AC
  Administered 2016-04-16: 1400 [IU] via INTRAVENOUS
  Filled 2016-04-16: qty 1400

## 2016-04-16 MED ORDER — HEPARIN BOLUS VIA INFUSION
2800.0000 [IU] | Freq: Once | INTRAVENOUS | Status: AC
  Administered 2016-04-16: 23:00:00 2800 [IU] via INTRAVENOUS
  Filled 2016-04-16: qty 2800

## 2016-04-16 MED ORDER — ASPIRIN EC 81 MG PO TBEC
81.0000 mg | DELAYED_RELEASE_TABLET | Freq: Every day | ORAL | Status: DC
  Administered 2016-04-16 – 2016-04-20 (×5): 81 mg via ORAL
  Filled 2016-04-16 (×6): qty 1

## 2016-04-16 MED ORDER — CARVEDILOL 6.25 MG PO TABS
6.2500 mg | ORAL_TABLET | Freq: Two times a day (BID) | ORAL | Status: DC
  Administered 2016-04-16: 6.25 mg via ORAL
  Filled 2016-04-16: qty 1

## 2016-04-16 MED ORDER — DEXTROSE 5 % IV SOLN
1.0000 g | INTRAVENOUS | Status: DC
  Administered 2016-04-16 – 2016-04-18 (×3): 1 g via INTRAVENOUS
  Filled 2016-04-16 (×3): qty 10

## 2016-04-16 MED ORDER — CARVEDILOL 3.125 MG PO TABS
3.1250 mg | ORAL_TABLET | Freq: Two times a day (BID) | ORAL | Status: DC
  Administered 2016-04-16 (×2): 3.125 mg via ORAL
  Filled 2016-04-16 (×2): qty 1

## 2016-04-16 NOTE — Progress Notes (Signed)
*  PRELIMINARY RESULTS* Echocardiogram 2D Echocardiogram has been performed.  Georgann HousekeeperJerry R Hege 04/16/2016, 10:20 AM

## 2016-04-16 NOTE — Progress Notes (Signed)
ANTICOAGULATION CONSULT NOTE - Follow Up Consult  Pharmacy Consult for Heparin  Indication: chest pain/ACS  No Known Allergies  Patient Measurements: Height: 5\' 11"  (180.3 cm) Weight: 207 lb 9.6 oz (94.167 kg) IBW/kg (Calculated) : 75.3 Heparin Dosing Weight: 94  Vital Signs: Temp: 100.2 F (37.9 C) (05/28 2115) Temp Source: Oral (05/28 2115) BP: 106/80 mmHg (05/28 2115) Pulse Rate: 115 (05/28 2115)  Labs:  Recent Labs  04/15/16 1007 04/15/16 1553 04/15/16 2207 04/16/16 0228 04/16/16 1240 04/16/16 2136  HGB 13.4  --   --  11.8*  --   --   HCT 40.0  --   --  36.0*  --   --   PLT 174  --   --  137*  --   --   LABPROT 15.9*  --   --   --   --   --   INR 1.26  --   --   --   --   --   HEPARINUNFRC  --   --   --  0.26* 0.29* 0.18*  CREATININE 1.95*  --   --  1.81*  --   --   TROPONINI 0.14* 1.38* 4.16* 7.92*  --   --     Estimated Creatinine Clearance: 42.6 mL/min (by C-G formula based on Cr of 1.81).   Medical History: Past Medical History  Diagnosis Date  . Hypertension   . History of rhabdomyolysis   . Weakness   . Vitamin disease   . Muscle weakness (generalized)   . Special sensory attacks (HCC)   . Urinary tract infection, site not specified   . Anemia, unspecified   . Thiamin deficiency   . Acute bronchitis   . Epidemic vomiting syndrome   . Change of dressing   . Paroxysmal tachycardia, unspecified (HCC)     Medications:  Prescriptions prior to admission  Medication Sig Dispense Refill Last Dose  . acetaminophen (TYLENOL) 650 MG CR tablet Take 650 mg by mouth 2 (two) times daily.   unknown  . amLODipine (NORVASC) 10 MG tablet Take 10 mg by mouth daily.   unknown  . gabapentin (NEURONTIN) 100 MG capsule Take 200 mg by mouth 2 (two) times daily.   unknown  . Multiple Vitamin (MULTIVITAMIN) tablet Take 1 tablet by mouth daily.   unknown  . traMADol (ULTRAM) 50 MG tablet Take 50 mg by mouth 3 (three) times daily as needed for moderate pain or severe  pain.   unknown   Scheduled:  . amLODipine  10 mg Oral Daily  . aspirin EC  81 mg Oral Daily  . carvedilol  6.25 mg Oral BID WC  . cefTRIAXone (ROCEPHIN)  IV  1 g Intravenous Q24H  . gabapentin  200 mg Oral BID  . multivitamin with minerals  1 tablet Oral Daily  . sodium chloride flush  3 mL Intravenous Q12H    Assessment: Pharmacy consulted to dose and monitor heparin in this 75 year old male for ACS.  HL at 1240 on 5/28: 0.29  Goal of Therapy:  Heparin level 0.3-0.7 units/ml Monitor platelets by anticoagulation protocol: Yes   Plan:  Heparin level subtherapeutic. 2800 unit IV x 1 bolus and increase rate to 1800 units/hr. Will recheck heparin level in 8 hours and CBC with AM labs.   Pharmacy will continue to follow.   Carola FrostNathan A Aayden Cefalu, Pharm.D., BCPS Clinical Pharmacist 04/16/2016,10:21 PM

## 2016-04-16 NOTE — Progress Notes (Signed)
ANTICOAGULATION CONSULT NOTE - Follow Up Consult  Pharmacy Consult for Heparin  Indication: chest pain/ACS  No Known Allergies  Patient Measurements: Height: 5\' 11"  (180.3 cm) Weight: 207 lb 9.6 oz (94.167 kg) IBW/kg (Calculated) : 75.3 Heparin Dosing Weight: 94  Vital Signs: Temp: 98.3 F (36.8 C) (05/28 1312) Temp Source: Oral (05/28 1312) BP: 118/87 mmHg (05/28 1312) Pulse Rate: 110 (05/28 1312)  Labs:  Recent Labs  04/15/16 1007 04/15/16 1553 04/15/16 2207 04/16/16 0228 04/16/16 1240  HGB 13.4  --   --  11.8*  --   HCT 40.0  --   --  36.0*  --   PLT 174  --   --  137*  --   LABPROT 15.9*  --   --   --   --   INR 1.26  --   --   --   --   HEPARINUNFRC  --   --   --  0.26* 0.29*  CREATININE 1.95*  --   --  1.81*  --   TROPONINI 0.14* 1.38* 4.16* 7.92*  --     Estimated Creatinine Clearance: 42.6 mL/min (by C-G formula based on Cr of 1.81).   Medical History: Past Medical History  Diagnosis Date  . Hypertension   . History of rhabdomyolysis   . Weakness   . Vitamin disease   . Muscle weakness (generalized)   . Special sensory attacks (HCC)   . Urinary tract infection, site not specified   . Anemia, unspecified   . Thiamin deficiency   . Acute bronchitis   . Epidemic vomiting syndrome   . Change of dressing   . Paroxysmal tachycardia, unspecified (HCC)     Medications:  Prescriptions prior to admission  Medication Sig Dispense Refill Last Dose  . acetaminophen (TYLENOL) 650 MG CR tablet Take 650 mg by mouth 2 (two) times daily.   unknown  . amLODipine (NORVASC) 10 MG tablet Take 10 mg by mouth daily.   unknown  . gabapentin (NEURONTIN) 100 MG capsule Take 200 mg by mouth 2 (two) times daily.   unknown  . Multiple Vitamin (MULTIVITAMIN) tablet Take 1 tablet by mouth daily.   unknown  . traMADol (ULTRAM) 50 MG tablet Take 50 mg by mouth 3 (three) times daily as needed for moderate pain or severe pain.   unknown   Scheduled:  . amLODipine  10 mg Oral  Daily  . aspirin EC  81 mg Oral Daily  . carvedilol  3.125 mg Oral BID WC  . cefTRIAXone (ROCEPHIN)  IV  1 g Intravenous Q24H  . gabapentin  200 mg Oral BID  . heparin  1,400 Units Intravenous Once  . multivitamin with minerals  1 tablet Oral Daily  . sodium chloride flush  3 mL Intravenous Q12H    Assessment: Pharmacy consulted to dose and monitor heparin in this 75 year old male for ACS.  HL at 1240 on 5/28: 0.29  Goal of Therapy:  Heparin level 0.3-0.7 units/ml Monitor platelets by anticoagulation protocol: Yes   Plan:  Heparin level subtherapeutic. 1400 unit IV x 1 bolus and increase rate to 1550 units/hr. Will recheck heparin level in 8 hours and CBC with AM labs.   Pharmacy will continue to follow.   Marceil Welp G, Pharm.D., BCPS Clinical Pharmacist 04/16/2016,1:45 PM

## 2016-04-16 NOTE — Progress Notes (Signed)
Pharmacy Antibiotic Note  Nathan NeighboursRalph L Figueroa is a 75 y.o. male admitted on 04/15/2016 with bacteremia.  Pharmacy has been consulted for vancomycin dosing.  Plan: Vancomycin 1 gm IV Q18H, predicted trough 17 mcg/mL. Pharmacy will continue to follow and adjust as needed to maintain trough 15 to 20 mcg/mL.   Vd 58 L, K3 0.04 hr-1, T1/2 17.4 hr  Height: 5\' 11"  (180.3 cm) Weight: 207 lb 9.6 oz (94.167 kg) IBW/kg (Calculated) : 75.3  Temp (24hrs), Avg:99.2 F (37.3 C), Min:97.6 F (36.4 C), Max:103.2 F (39.6 C)   Recent Labs Lab 04/15/16 1007 04/16/16 0228  WBC 28.1* 18.4*  CREATININE 1.95* 1.81*  LATICACIDVEN 1.5  --     Estimated Creatinine Clearance: 42.6 mL/min (by C-G formula based on Cr of 1.81).    No Known Allergies   Thank you for allowing pharmacy to be a part of this patient's care.  Carola FrostNathan A Azlaan Figueroa, Pharm.D., BCPS Clinical Pharmacist 04/16/2016 6:40 AM

## 2016-04-16 NOTE — Progress Notes (Signed)
Nathan NeighboursRalph L Figueroa is a 75 y.o. male  [id number]  Primary Cardiologist: Adrian BlackwaterShaukat Khan Reason for Consultation: Non-STEMI  HPI: 75 year old white male with a past medical history of multiple medical problems presented to the hospital with confusion/altered mental status and was found to have a fever of 102 and saturation of 88%. Patient denies any chest pain or shortness of breath but appears to be quite confused.   Review of Systems: Patient does not appear to be in any distress and denies any chest pain or shortness of breath   Past Medical History  Diagnosis Date  . Hypertension   . History of rhabdomyolysis   . Weakness   . Vitamin disease   . Muscle weakness (generalized)   . Special sensory attacks (HCC)   . Urinary tract infection, site not specified   . Anemia, unspecified   . Thiamin deficiency   . Acute bronchitis   . Epidemic vomiting syndrome   . Change of dressing   . Paroxysmal tachycardia, unspecified (HCC)     Medications Prior to Admission  Medication Sig Dispense Refill  . acetaminophen (TYLENOL) 650 MG CR tablet Take 650 mg by mouth 2 (two) times daily.    Marland Kitchen. amLODipine (NORVASC) 10 MG tablet Take 10 mg by mouth daily.    Marland Kitchen. gabapentin (NEURONTIN) 100 MG capsule Take 200 mg by mouth 2 (two) times daily.    . Multiple Vitamin (MULTIVITAMIN) tablet Take 1 tablet by mouth daily.    . traMADol (ULTRAM) 50 MG tablet Take 50 mg by mouth 3 (three) times daily as needed for moderate pain or severe pain.       Marland Kitchen. amLODipine  10 mg Oral Daily  . aspirin EC  81 mg Oral Daily  . carvedilol  3.125 mg Oral BID WC  . cefTRIAXone (ROCEPHIN)  IV  1 g Intravenous Q24H  . gabapentin  200 mg Oral BID  . multivitamin with minerals  1 tablet Oral Daily  . sodium chloride flush  3 mL Intravenous Q12H    Infusions: . heparin 1,350 Units/hr (04/16/16 [id number])    No Known Allergies  Social History   Social History  . Marital Status: Divorced    Spouse Name: N/A  .  Number of Children: N/A  . Years of Education: N/A   Occupational History  . Not on file.   Social History Main Topics  . Smoking status: Never Smoker   . Smokeless tobacco: Not on file  . Alcohol Use: Not on file  . Drug Use: Not on file  . Sexual Activity: Not on file   Other Topics Concern  . Not on file   Social History Narrative  . No narrative on file    History reviewed. No pertinent family history.  PHYSICAL EXAM: Filed Vitals:   04/16/16 0439 04/16/16 0856  BP: 103/74 109/77  Pulse: 96 115  Temp: 97.6 F (36.4 C) 97.4 F (36.3 C)  Resp: 17 20     Intake/Output Summary (Last 24 hours) at 04/16/16 1040 Last data filed at 04/16/16 0515  Gross per 24 hour  Intake   3490 ml  Output    700 ml  Net   2790 ml    General:  Well appearing. No respiratory difficulty HEENT: normal Neck: supple. no JVD. Carotids 2+ bilat; no bruits. No lymphadenopathy or thryomegaly appreciated. Cor: PMI nondisplaced. Regular rate & rhythm. No rubs, gallops or murmurs. Lungs: clear Abdomen: soft, nontender, nondistended. No hepatosplenomegaly. No bruits or  masses. Good bowel sounds. Extremities: no cyanosis, clubbing, rash, edema Neuro: alert & oriented x 3, cranial nerves grossly intact. moves all 4 extremities w/o difficulty. Affect pleasant.  ZOX:WRUEA tachycardia with nonspecific ST-T changes  Results for orders placed or performed during the hospital encounter of 04/15/16 (from the past 24 hour(s))  MRSA PCR Screening     Status: None   Collection Time: 04/15/16  2:25 PM  Result Value Ref Range   MRSA by PCR NEGATIVE NEGATIVE  Troponin I     Status: Abnormal   Collection Time: 04/15/16  3:53 PM  Result Value Ref Range   Troponin I 1.38 (H) <0.031 ng/mL  Troponin I     Status: Abnormal   Collection Time: 04/15/16 10:07 PM  Result Value Ref Range   Troponin I 4.16 (H) <0.031 ng/mL  Troponin I     Status: Abnormal   Collection Time: 04/16/16  2:28 AM  Result Value Ref  Range   Troponin I 7.92 (H) <0.031 ng/mL  Basic metabolic panel     Status: Abnormal   Collection Time: 04/16/16  2:28 AM  Result Value Ref Range   Sodium 145 135 - 145 mmol/L   Potassium 3.6 3.5 - 5.1 mmol/L   Chloride 113 (H) 101 - 111 mmol/L   CO2 23 22 - 32 mmol/L   Glucose, Bld 132 (H) 65 - 99 mg/dL   BUN 19 6 - 20 mg/dL   Creatinine, Ser 5.40 (H) 0.61 - 1.24 mg/dL   Calcium 8.0 (L) 8.9 - 10.3 mg/dL   GFR calc non Af Amer 35 (L) >60 mL/min   GFR calc Af Amer 41 (L) >60 mL/min   Anion gap 9 5 - 15  Heparin level (unfractionated)     Status: Abnormal   Collection Time: 04/16/16  2:28 AM  Result Value Ref Range   Heparin Unfractionated 0.26 (L) 0.30 - 0.70 IU/mL  CBC     Status: Abnormal   Collection Time: 04/16/16  2:28 AM  Result Value Ref Range   WBC 18.4 (H) 3.8 - 10.6 K/uL   RBC 3.98 (L) 4.40 - 5.90 MIL/uL   Hemoglobin 11.8 (L) 13.0 - 18.0 g/dL   HCT 98.1 (L) 19.1 - 47.8 %   MCV 90.6 80.0 - 100.0 fL   MCH 29.8 26.0 - 34.0 pg   MCHC 32.9 32.0 - 36.0 g/dL   RDW 29.5 62.1 - 30.8 %   Platelets 137 (L) 150 - 440 K/uL   Dg Chest Port 1 View  04/15/2016  CLINICAL DATA:  Sepsis.  Diaphoresis.  Fever.  Tachycardia. EXAM: PORTABLE CHEST 1 VIEW COMPARISON:  09/01/2012. FINDINGS: Poor inspiration. Interval mild enlargement of the cardiac silhouette. Interval mild prominence of the interstitial markings. Progressive elevation of the right hemidiaphragm. No airspace consolidation seen. Diffuse osteopenia. Right hilar surgical clips are again demonstrated. IMPRESSION: 1. Interval mild cardiomegaly and mild interstitial lung disease with an appearance suggesting chronic interstitial lung disease. 2. No evidence of pneumonia on a single view. Electronically Signed   By: Beckie Salts M.D.   On: 04/15/2016 10:35     ASSESSMENT AND PLAN:Mildly elevated troponin status posts confusional episode and history of rhabdomyolysis hypertension and dementia. Patient is not a candidate for invasive  procedure but will get echocardiogram to evaluate wall motion and ejection fraction.  KHAN,SHAUKAT A

## 2016-04-16 NOTE — Plan of Care (Signed)
Problem: Fluid Volume: Goal: Ability to maintain a balanced intake and output will improve Heparin drip cont. Bolus dose today and rate increased to 15.5 units / hr   Problem: Nutrition: Goal: Adequate nutrition will be maintained Outcome: Progressing tol diet fair  Problem: Respiratory: Goal: Ability to maintain adequate ventilation will improve 02 cont at 2 l [place]

## 2016-04-16 NOTE — Progress Notes (Signed)
ANTICOAGULATION CONSULT NOTE - Initial Consult  Pharmacy Consult for Heparin  Indication: chest pain/ACS  No Known Allergies  Patient Measurements: Height: 5\' 11"  (180.3 cm) Weight: 207 lb 9.6 oz (94.167 kg) IBW/kg (Calculated) : 75.3 Heparin Dosing Weight: 94  Vital Signs: Temp: 100.9 F (38.3 C) (05/28 0041) Temp Source: Oral (05/28 0041) BP: 119/79 mmHg (05/28 0106) Pulse Rate: 109 (05/27 2252)  Labs:  Recent Labs  04/15/16 1007 04/15/16 1553 04/15/16 2207 04/16/16 0228  HGB 13.4  --   --  11.8*  HCT 40.0  --   --  36.0*  PLT 174  --   --  137*  LABPROT 15.9*  --   --   --   INR 1.26  --   --   --   HEPARINUNFRC  --   --   --  0.26*  CREATININE 1.95*  --   --  1.81*  TROPONINI 0.14* 1.38* 4.16* 7.92*    Estimated Creatinine Clearance: 42.6 mL/min (by C-G formula based on Cr of 1.81).   Medical History: Past Medical History  Diagnosis Date  . Hypertension   . History of rhabdomyolysis   . Weakness   . Vitamin disease   . Muscle weakness (generalized)   . Special sensory attacks (HCC)   . Urinary tract infection, site not specified   . Anemia, unspecified   . Thiamin deficiency   . Acute bronchitis   . Epidemic vomiting syndrome   . Change of dressing   . Paroxysmal tachycardia, unspecified (HCC)     Medications:  Prescriptions prior to admission  Medication Sig Dispense Refill Last Dose  . acetaminophen (TYLENOL) 650 MG CR tablet Take 650 mg by mouth 2 (two) times daily.   unknown  . amLODipine (NORVASC) 10 MG tablet Take 10 mg by mouth daily.   unknown  . gabapentin (NEURONTIN) 100 MG capsule Take 200 mg by mouth 2 (two) times daily.   unknown  . Multiple Vitamin (MULTIVITAMIN) tablet Take 1 tablet by mouth daily.   unknown  . traMADol (ULTRAM) 50 MG tablet Take 50 mg by mouth 3 (three) times daily as needed for moderate pain or severe pain.   unknown   Scheduled:  . amLODipine  10 mg Oral Daily  . carvedilol  3.125 mg Oral BID WC  .  gabapentin  200 mg Oral BID  . meropenem (MERREM) IV  1 g Intravenous Q12H  . multivitamin with minerals  1 tablet Oral Daily  . sodium chloride flush  3 mL Intravenous Q12H    Assessment: Pharmacy consulted to dose and monitor heparin in this 75 year old male for ACS.   Goal of Therapy:  Heparin level 0.3-0.7 units/ml Monitor platelets by anticoagulation protocol: Yes   Plan:  Heparin level subtherapeutic. 1400 unit IV x 1 bolus and increase rate to 1350 units/hr. Will recheck heparin level in 8 hours.  Carola FrostNathan A Montrey Buist, Pharm.D., BCPS Clinical Pharmacist 04/16/2016,3:37 AM

## 2016-04-16 NOTE — Progress Notes (Signed)
Patient ID: Nathan Figueroa, male   DOB: [birth year], 75 y.o.   MRN: [id number] [facility] PROGRESS NOTE  Nathan Figueroa [id number] DOB: [birth year] DOA: 04/15/2016 PCP: Dorothey Baseman, MD  HPI/Subjective: Patient brought in with altered mental status. He had a fever of 102 in the emergency room. Patient was not able to communicate yesterday. He feels tired and weak today.  Objective: Filed Vitals:   04/16/16 0856 04/16/16 1312  BP: 109/77 118/87  Pulse: 115 110  Temp: 97.4 F (36.3 C) 98.3 F (36.8 C)  Resp: 20 20    Filed Weights   04/15/16 1022 04/15/16 1700  Weight: 101 kg (222 lb 10.6 oz) 94.167 kg (207 lb 9.6 oz)    ROS: Review of Systems  Constitutional: Negative for fever and chills.  Eyes: Negative for blurred vision.  Respiratory: Positive for shortness of breath. Negative for cough.   Cardiovascular: Negative for chest pain.  Gastrointestinal: Negative for nausea, vomiting, abdominal pain, diarrhea and constipation.  Genitourinary: Negative for dysuria.  Musculoskeletal: Negative for joint pain.  Neurological: Negative for dizziness and headaches.   Exam: Physical Exam  HENT:  Nose: No mucosal edema.  Mouth/Throat: No oropharyngeal exudate or posterior oropharyngeal edema.  Eyes: Conjunctivae, EOM and lids are normal. Pupils are equal, round, and reactive to light.  Neck: No JVD present. Carotid bruit is not present. No edema present. No thyroid mass and no thyromegaly present.  Cardiovascular: S1 normal and S2 normal.  Exam reveals no gallop.   No murmur heard. Pulses:      Dorsalis pedis pulses are 2+ on the right side, and 2+ on the left side.  Respiratory: No respiratory distress. He has no wheezes. He has rhonchi in the right lower field. He has no rales.  GI: Soft. Bowel sounds are normal. There is no tenderness.  Musculoskeletal:       Right ankle: He exhibits swelling.       Left ankle: He exhibits swelling.  Lymphadenopathy:    He  has no cervical adenopathy.  Neurological: He is alert. No cranial nerve deficit.  Skin: Skin is warm. No rash noted. Nails show no clubbing.  Psychiatric: He has a normal mood and affect.      Data Reviewed: Basic Metabolic Panel:  Recent Labs Lab 04/15/16 1007 04/16/16 0228  NA 145 145  K 3.5 3.6  CL 109 113*  CO2 23 23  GLUCOSE 181* 132*  BUN 22* 19  CREATININE 1.95* 1.81*  CALCIUM 8.9 8.0*   Liver Function Tests:  Recent Labs Lab 04/15/16 1007  AST 25  ALT 16*  ALKPHOS 108  BILITOT 1.4*  PROT 8.8*  ALBUMIN 3.4*    Recent Labs Lab 04/15/16 1007  LIPASE 19   CBC:  Recent Labs Lab 04/15/16 1007 04/16/16 0228  WBC 28.1* 18.4*  NEUTROABS 23.0*  --   HGB 13.4 11.8*  HCT 40.0 36.0*  MCV 89.9 90.6  PLT 174 137*   Cardiac Enzymes:  Recent Labs Lab 04/15/16 1007 04/15/16 1553 04/15/16 2207 04/16/16 0228  TROPONINI 0.14* 1.38* 4.16* 7.92*     Recent Results (from the past 240 hour(s))  Blood Culture (routine x 2)     Status: None (Preliminary result)   Collection Time: 04/15/16 10:07 AM  Result Value Ref Range Status   Specimen Description BLOOD LEFT WRIST  Final   Special Requests BOTTLES DRAWN AEROBIC AND ANAEROBIC  5CC  Final   Culture NO GROWTH < 24 HOURS  Final  Report Status PENDING  Incomplete  Blood Culture (routine x 2)     Status: Abnormal (Preliminary result)   Collection Time: 04/15/16 10:07 AM  Result Value Ref Range Status   Specimen Description BLOOD RIGHT WRIST  Final   Special Requests BOTTLES DRAWN AEROBIC AND ANAEROBIC  2CC  Final   Culture  Setup Time   Final    GRAM POSITIVE COCCI AEROBIC BOTTLE ONLY CRITICAL RESULT CALLED TO, READ BACK BY AND VERIFIED WITH: NATE COOKSON AT [id number] 04/16/16.PM[facility] CONFIRMED BY [facility]    Culture (A)  Final    STAPHYLOCOCCUS SPECIES AEROBIC BOTTLE ONLY Results consistent with contamination.    Report Status PENDING  Incomplete  Urine culture     Status: Abnormal   Collection Time:  04/15/16 10:07 AM  Result Value Ref Range Status   Specimen Description URINE, RANDOM  Final   Special Requests NONE  Final   Culture (A)  Final    <10,000 COLONIES/mL INSIGNIFICANT GROWTH [facility]     Report Status 04/16/2016 FINAL  Final  Blood Culture ID Panel (Reflexed)     Status: Abnormal   Collection Time: 04/15/16 10:07 AM  Result Value Ref Range Status   Enterococcus species NOT DETECTED NOT DETECTED Final   Vancomycin resistance NOT DETECTED NOT DETECTED Final   Listeria monocytogenes NOT DETECTED NOT DETECTED Final   Staphylococcus species DETECTED (A) NOT DETECTED Final    Comment: CRITICAL RESULT CALLED TO, READ BACK BY AND VERIFIED WITH: NATE COOKSON AT [id number] 04/16/16.PMH    Staphylococcus aureus NOT DETECTED NOT DETECTED Final   Methicillin resistance DETECTED (A) NOT DETECTED Final    Comment: CRITICAL RESULT CALLED TO, READ BACK BY AND VERIFIED WITH: NATE COOKSON AT [id number] 04/16/16.PMH    Streptococcus species NOT DETECTED NOT DETECTED Final   Streptococcus agalactiae NOT DETECTED NOT DETECTED Final   Streptococcus pneumoniae NOT DETECTED NOT DETECTED Final   Streptococcus pyogenes NOT DETECTED NOT DETECTED Final   Acinetobacter baumannii NOT DETECTED NOT DETECTED Final   Enterobacteriaceae species NOT DETECTED NOT DETECTED Final   Enterobacter cloacae complex NOT DETECTED NOT DETECTED Final   Escherichia coli NOT DETECTED NOT DETECTED Final   Klebsiella oxytoca NOT DETECTED NOT DETECTED Final   Klebsiella pneumoniae NOT DETECTED NOT DETECTED Final   Proteus species NOT DETECTED NOT DETECTED Final   Serratia marcescens NOT DETECTED NOT DETECTED Final   Carbapenem resistance NOT DETECTED NOT DETECTED Final   Haemophilus influenzae NOT DETECTED NOT DETECTED Final   Neisseria meningitidis NOT DETECTED NOT DETECTED Final   Pseudomonas aeruginosa NOT DETECTED NOT DETECTED Final   Candida albicans NOT DETECTED NOT DETECTED Final   Candida glabrata  NOT DETECTED NOT DETECTED Final   Candida krusei NOT DETECTED NOT DETECTED Final   Candida parapsilosis NOT DETECTED NOT DETECTED Final   Candida tropicalis NOT DETECTED NOT DETECTED Final  MRSA PCR Screening     Status: None   Collection Time: 04/15/16  2:25 PM  Result Value Ref Range Status   MRSA by PCR NEGATIVE NEGATIVE Final    Comment:        The GeneXpert MRSA Assay (FDA approved for NASAL specimens only), is one component of a comprehensive MRSA colonization surveillance program. It is not intended to diagnose MRSA infection nor to guide or monitor treatment for MRSA infections.      Studies: Dg Chest Port 1 View  04/15/2016  CLINICAL DATA:  Sepsis.  Diaphoresis.  Fever.  Tachycardia. EXAM: PORTABLE CHEST 1  VIEW COMPARISON:  09/01/2012. FINDINGS: Poor inspiration. Interval mild enlargement of the cardiac silhouette. Interval mild prominence of the interstitial markings. Progressive elevation of the right hemidiaphragm. No airspace consolidation seen. Diffuse osteopenia. Right hilar surgical clips are again demonstrated. IMPRESSION: 1. Interval mild cardiomegaly and mild interstitial lung disease with an appearance suggesting chronic interstitial lung disease. 2. No evidence of pneumonia on a single view. Electronically Signed   By: Beckie Salts M.D.   On: 04/15/2016 10:35    Scheduled Meds: . amLODipine  10 mg Oral Daily  . aspirin EC  81 mg Oral Daily  . carvedilol  6.25 mg Oral BID WC  . cefTRIAXone (ROCEPHIN)  IV  1 g Intravenous Q24H  . gabapentin  200 mg Oral BID  . heparin  1,400 Units Intravenous Once  . multivitamin with minerals  1 tablet Oral Daily  . sodium chloride flush  3 mL Intravenous Q12H   Continuous Infusions: . heparin 1,550 Units/hr (04/16/16 1348)    Assessment/Plan:  1. Clinical sepsis with acute encephalopathy. Patient had fever or tachycardia and leukocytosis. Initially felt to be secondary to urinary tract infection and/or pneumonia.  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 Urine culture less than 10,000 colonies. Blood culture was a contamination. The aggressive antibiotics of vancomycin and Zosyn were switched over to Rocephin. Unclear what on treating at this point. 2. Non-STEMI. Continue heparin drip for 24-48 hours. Continue aspirin and Coreg. Seen in consultation by cardiology. Likely medical management. Check a lipid profile. With history of rhabdomyolysis I am hesitant on statin at this point. 3. Acute kidney injury. Last creatinine on record was in 2016 which was normal. Continue to monitor with hydration. Hold nephrotoxic medications. 4. Essential hypertension continue Norvasc and Coreg. 5. Weakness 6. Anemia. Continue to monitor  Code Status:     Code Status Orders        Start     Ordered   04/15/16 1402  Full code   Continuous     04/15/16 1401    Code Status History    Date Active Date Inactive Code Status Order ID Comments User Context   This patient has a current code status but no historical code status.    Advance Directive Documentation        Most Recent Value   Type of Advance Directive  Out of facility DNR (pink MOST or yellow form)   Pre-existing out of facility DNR order (yellow form or pink MOST form)  Pink MOST form placed in chart (order not valid for inpatient use)   "MOST" Form in Place?       Disposition Plan: To be determined  Consultants: Cardiology  Antibiotics:  Rocephin  Time spent: 25 minutes  Alford Highland  [employer]

## 2016-04-17 ENCOUNTER — Inpatient Hospital Stay: Payer: Medicare Other

## 2016-04-17 LAB — CBC
HCT: 35 % — ABNORMAL LOW (ref 40.0–52.0)
Hemoglobin: 11.6 g/dL — ABNORMAL LOW (ref 13.0–18.0)
MCH: 29.9 pg (ref 26.0–34.0)
MCHC: 33 g/dL (ref 32.0–36.0)
MCV: 90.5 fL (ref 80.0–100.0)
PLATELETS: 138 10*3/uL — AB (ref 150–440)
RBC: 3.87 MIL/uL — ABNORMAL LOW (ref 4.40–5.90)
RDW: 14.2 % (ref 11.5–14.5)
WBC: 14.6 10*3/uL — AB (ref 3.8–10.6)

## 2016-04-17 LAB — BLOOD GAS, ARTERIAL
Acid-Base Excess: 1 mmol/L (ref 0.0–3.0)
Allens test (pass/fail): POSITIVE — AB
BICARBONATE: 23.8 meq/L (ref 21.0–28.0)
FIO2: 0.36
O2 SAT: 96 %
PATIENT TEMPERATURE: 37
PCO2 ART: 32 mmHg (ref 32.0–48.0)
PO2 ART: 76 mmHg — AB (ref 83.0–108.0)
pH, Arterial: 7.48 — ABNORMAL HIGH (ref 7.350–7.450)

## 2016-04-17 LAB — HEPARIN LEVEL (UNFRACTIONATED): HEPARIN UNFRACTIONATED: 0.56 [IU]/mL (ref 0.30–0.70)

## 2016-04-17 MED ORDER — CARVEDILOL 12.5 MG PO TABS
12.5000 mg | ORAL_TABLET | Freq: Two times a day (BID) | ORAL | Status: DC
  Administered 2016-04-17 – 2016-04-20 (×7): 12.5 mg via ORAL
  Filled 2016-04-17 (×7): qty 1

## 2016-04-17 MED ORDER — CETYLPYRIDINIUM CHLORIDE 0.05 % MT LIQD
7.0000 mL | Freq: Two times a day (BID) | OROMUCOSAL | Status: DC
  Administered 2016-04-17 – 2016-04-20 (×6): 7 mL via OROMUCOSAL

## 2016-04-17 MED ORDER — DILTIAZEM HCL ER COATED BEADS 120 MG PO CP24
120.0000 mg | ORAL_CAPSULE | Freq: Every day | ORAL | Status: DC
  Administered 2016-04-17 – 2016-04-20 (×4): 120 mg via ORAL
  Filled 2016-04-17 (×4): qty 1

## 2016-04-17 NOTE — Progress Notes (Signed)
Patient ID: Nathan Figueroa, male   DOB: 12/01/1940, 75 y.o.   MRN: [id number] [employer] [facility] PROGRESS NOTE  Nathan Figueroa BJY:[id number] DOB: 10/07/1941 DOA: 04/15/2016 PCP: Dorothey BasemanAVID BRONSTEIN, MD  HPI/Subjective: Patient brought in with altered mental status. Patient still had fever as of yesterday. Patient states he has joint pains all over. No complaints of chest pain or shortness of breath.  Objective: Filed Vitals:   04/17/16 0900 04/17/16 1228  BP: 118/89 119/85  Pulse: 130 112  Temp: 98.8 F (37.1 C) 98.4 F (36.9 C)  Resp: 22 23    Filed Weights   04/15/16 1022 04/15/16 1700  Weight: 101 kg (222 lb 10.6 oz) 94.167 kg (207 lb 9.6 oz)    ROS: Review of Systems  Constitutional: Negative for fever and chills.  Eyes: Negative for blurred vision.  Respiratory: Negative for cough and shortness of breath.   Cardiovascular: Negative for chest pain.  Gastrointestinal: Negative for nausea, vomiting, abdominal pain, diarrhea and constipation.  Genitourinary: Negative for dysuria.  Musculoskeletal: Positive for joint pain.  Neurological: Negative for dizziness and headaches.   Exam: Physical Exam  HENT:  Nose: No mucosal edema.  Mouth/Throat: No oropharyngeal exudate or posterior oropharyngeal edema.  Eyes: Conjunctivae and lids are normal. Pupils are equal, round, and reactive to light.  Had to open his left eyelid for him but that he was able to hold it up a little bit.  Neck: No JVD present. Carotid bruit is not present. No edema present. No thyroid mass and no thyromegaly present.  Cardiovascular: S1 normal and S2 normal.  Exam reveals no gallop.   No murmur heard. Pulses:      Dorsalis pedis pulses are 2+ on the right side, and 2+ on the left side.  Respiratory: No respiratory distress. He has no wheezes. He has rhonchi in the right lower field. He has no rales.  GI: Soft. Bowel sounds are normal. There is no tenderness.  Musculoskeletal:       Right ankle: He  exhibits swelling.       Left ankle: He exhibits swelling.  Lymphadenopathy:    He has no cervical adenopathy.  Neurological: He is alert.  Skin: Skin is warm. No rash noted. Nails show no clubbing.  Psychiatric: He has a normal mood and affect.      Data Reviewed: Basic Metabolic Panel:  Recent Labs Lab 04/15/16 1007 04/16/16 0228  NA 145 145  K 3.5 3.6  CL 109 113*  CO2 23 23  GLUCOSE 181* 132*  BUN 22* 19  CREATININE 1.95* 1.81*  CALCIUM 8.9 8.0*   Liver Function Tests:  Recent Labs Lab 04/15/16 1007  AST 25  ALT 16*  ALKPHOS 108  BILITOT 1.4*  PROT 8.8*  ALBUMIN 3.4*    Recent Labs Lab 04/15/16 1007  LIPASE 19   CBC:  Recent Labs Lab 04/15/16 1007 04/16/16 0228 04/17/16 0654  WBC 28.1* 18.4* 14.6*  NEUTROABS 23.0*  --   --   HGB 13.4 11.8* 11.6*  HCT 40.0 36.0* 35.0*  MCV 89.9 90.6 90.5  PLT 174 137* 138*   Cardiac Enzymes:  Recent Labs Lab 04/15/16 1007 04/15/16 1553 04/15/16 2207 04/16/16 0228  TROPONINI 0.14* 1.38* 4.16* 7.92*     Recent Results (from the past 240 hour(s))  Blood Culture (routine x 2)     Status: None (Preliminary result)   Collection Time: 04/15/16 10:07 AM  Result Value Ref Range Status   Specimen Description BLOOD LEFT WRIST  Final   Special Requests BOTTLES DRAWN AEROBIC AND ANAEROBIC  5CC  Final   Culture NO GROWTH 2 DAYS  Final   Report Status PENDING  Incomplete  Blood Culture (routine x 2)     Status: Abnormal (Preliminary result)   Collection Time: 04/15/16 10:07 AM  Result Value Ref Range Status   Specimen Description BLOOD RIGHT WRIST  Final   Special Requests BOTTLES DRAWN AEROBIC AND ANAEROBIC  2CC  Final   Culture  Setup Time   Final    GRAM POSITIVE COCCI AEROBIC BOTTLE ONLY CRITICAL RESULT CALLED TO, READ BACK BY AND VERIFIED WITH: NATE COOKSON AT 1610 04/16/16.PMH CONFIRMED BY [facility]    Culture (A)  Final    STAPHYLOCOCCUS SPECIES AEROBIC BOTTLE ONLY Results consistent with  contamination.    Report Status PENDING  Incomplete  Urine culture     Status: Abnormal   Collection Time: 04/15/16 10:07 AM  Result Value Ref Range Status   Specimen Description URINE, RANDOM  Final   Special Requests NONE  Final   Culture (A)  Final    <10,000 COLONIES/mL INSIGNIFICANT GROWTH Performed at [facility]    Report Status 04/16/2016 FINAL  Final  Blood Culture ID Panel (Reflexed)     Status: Abnormal   Collection Time: 04/15/16 10:07 AM  Result Value Ref Range Status   Enterococcus species NOT DETECTED NOT DETECTED Final   Vancomycin resistance NOT DETECTED NOT DETECTED Final   Listeria monocytogenes NOT DETECTED NOT DETECTED Final   Staphylococcus species DETECTED (A) NOT DETECTED Final    Comment: CRITICAL RESULT CALLED TO, READ BACK BY AND VERIFIED WITH: NATE COOKSON AT 9604 04/16/16.PMH    Staphylococcus aureus NOT DETECTED NOT DETECTED Final   Methicillin resistance DETECTED (A) NOT DETECTED Final    Comment: CRITICAL RESULT CALLED TO, READ BACK BY AND VERIFIED WITH: NATE COOKSON AT 5409 04/16/16.PMH    Streptococcus species NOT DETECTED NOT DETECTED Final   Streptococcus agalactiae NOT DETECTED NOT DETECTED Final   Streptococcus pneumoniae NOT DETECTED NOT DETECTED Final   Streptococcus pyogenes NOT DETECTED NOT DETECTED Final   Acinetobacter baumannii NOT DETECTED NOT DETECTED Final   Enterobacteriaceae species NOT DETECTED NOT DETECTED Final   Enterobacter cloacae complex NOT DETECTED NOT DETECTED Final   Escherichia coli NOT DETECTED NOT DETECTED Final   Klebsiella oxytoca NOT DETECTED NOT DETECTED Final   Klebsiella pneumoniae NOT DETECTED NOT DETECTED Final   Proteus species NOT DETECTED NOT DETECTED Final   Serratia marcescens NOT DETECTED NOT DETECTED Final   Carbapenem resistance NOT DETECTED NOT DETECTED Final   Haemophilus influenzae NOT DETECTED NOT DETECTED Final   Neisseria meningitidis NOT DETECTED NOT DETECTED Final   Pseudomonas  aeruginosa NOT DETECTED NOT DETECTED Final   Candida albicans NOT DETECTED NOT DETECTED Final   Candida glabrata NOT DETECTED NOT DETECTED Final   Candida krusei NOT DETECTED NOT DETECTED Final   Candida parapsilosis NOT DETECTED NOT DETECTED Final   Candida tropicalis NOT DETECTED NOT DETECTED Final  MRSA PCR Screening     Status: None   Collection Time: 04/15/16  2:25 PM  Result Value Ref Range Status   MRSA by PCR NEGATIVE NEGATIVE Final    Comment:        The GeneXpert MRSA Assay (FDA approved for NASAL specimens only), is one component of a comprehensive MRSA colonization surveillance program. It is not intended to diagnose MRSA infection nor to guide or monitor treatment for MRSA infections.  Scheduled Meds: . antiseptic oral rinse  7 mL Mouth Rinse BID  . aspirin EC  81 mg Oral Daily  . carvedilol  12.5 mg Oral BID WC  . cefTRIAXone (ROCEPHIN)  IV  1 g Intravenous Q24H  . diltiazem  120 mg Oral Daily  . gabapentin  200 mg Oral BID  . multivitamin with minerals  1 tablet Oral Daily  . sodium chloride flush  3 mL Intravenous Q12H    Assessment/Plan:  1. Clinical sepsis with acute encephalopathy. Patient had fever or tachycardia and leukocytosis. Initially felt to be secondary to urinary tract infection and/or pneumonia. 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 Urine culture less than 10,000 colonies. Blood culture was a contamination. The aggressive antibiotics of vancomycin and Zosyn were switched over to Rocephin. Unclear what on treating at this point.Repeat chest x-ray today. White blood cell count trending better. Temperature curve trending better also. 2. Non-STEMI. Stop heparin drip today. Continue aspirin and Coreg. Seen in consultation by cardiology. Likely medical management. With history of rhabdomyolysis I am hesitant on statin at this point. 3. Acute kidney injury. Last creatinine on record was in 2016 which was normal. Continue to  monitor. Hold nephrotoxic medications. 4. Essential hypertension and tachycardia. Start Cardizem CD and continue Coreg. 5. Weakness. Likely back to [facility] and next day or so 6. Anemia. Continue to monitor  Code Status:     Code Status Orders        Start     Ordered   04/15/16 1402  Full code   Continuous     04/15/16 1401    Code Status History    Date Active Date Inactive Code Status Order ID Comments User Context   This patient has a current code status but no historical code status.    Advance Directive Documentation        Most Recent Value   Type of Advance Directive  Out of facility DNR (pink MOST or yellow form)   Pre-existing out of facility DNR order (yellow form or pink MOST form)  Pink MOST form placed in chart (order not valid for inpatient use)   "MOST" Form in Place?       Disposition Plan: Back to [facility] and next day or so. Spoke with sister on the phone.  Consultants: Cardiology  Antibiotics:  Rocephin  Time spent: 25 minutes  Alford Highland  [employer]

## 2016-04-17 NOTE — Care Management (Signed)
Admitted to [facility] with the diagnosis of sepsis. A resident of [facility] since 09/02/12. Nathan Figueroa is listed as next of kin [phone]). Dr. Terance HartBronstein is listed as primary care physician Low grade temperature = 100.2. IV Rocephin continues.  Nathan GreetBrenda S [name] RN MSN CCM Care Management [phone]

## 2016-04-17 NOTE — Progress Notes (Signed)
ANTICOAGULATION CONSULT NOTE - Follow Up Consult  Pharmacy Consult for Heparin  Indication: chest pain/ACS  No Known Allergies  Patient Measurements: Height: 5\' 11"  (180.3 cm) Weight: 207 lb 9.6 oz (94.167 kg) IBW/kg (Calculated) : 75.3 Heparin Dosing Weight: 94  Vital Signs:   Temp: 98.2 F (36.8 C) (05/29 0438) Temp Source: Oral (05/29 0438) BP: 104/73 mmHg (05/29 0438) Pulse Rate: 127 (05/29 0438)  Labs:  Recent Labs  04/15/16 1007 04/15/16 1553 04/15/16 2207  04/16/16 0228 04/16/16 1240 04/16/16 2136 04/17/16 0654  HGB 13.4  --   --   --  11.8*  --   --  11.6*  HCT 40.0  --   --   --  36.0*  --   --  35.0*  PLT 174  --   --   --  137*  --   --  138*  LABPROT 15.9*  --   --   --   --   --   --   --   INR 1.26  --   --   --   --   --   --   --   HEPARINUNFRC  --   --   --   < > 0.26* 0.29* 0.18* 0.56  CREATININE 1.95*  --   --   --  1.81*  --   --   --   TROPONINI 0.14* 1.38* 4.16*  --  7.92*  --   --   --   < > = values in this interval not displayed.  Estimated Creatinine Clearance: 42.6 mL/min (by C-G formula based on Cr of 1.81).   Medical History: Past Medical History  Diagnosis Date  . Hypertension   . History of rhabdomyolysis   . Weakness   . Vitamin disease   . Muscle weakness (generalized)   . Special sensory attacks (HCC)   . Urinary tract infection, site not specified   . Anemia, unspecified   . Thiamin deficiency   . Acute bronchitis   . Epidemic vomiting syndrome   . Change of dressing   . Paroxysmal tachycardia, unspecified (HCC)     Medications:  Prescriptions prior to admission  Medication Sig Dispense Refill Last Dose  . acetaminophen (TYLENOL) 650 MG CR tablet Take 650 mg by mouth 2 (two) times daily.   unknown  . amLODipine (NORVASC) 10 MG tablet Take 10 mg by mouth daily.   unknown  . gabapentin (NEURONTIN) 100 MG capsule Take 200 mg by mouth 2 (two) times daily.   unknown  . Multiple Vitamin (MULTIVITAMIN) tablet Take 1  tablet by mouth daily.   unknown  . traMADol (ULTRAM) 50 MG tablet Take 50 mg by mouth 3 (three) times daily as needed for moderate pain or severe pain.   unknown   Scheduled:  . amLODipine  10 mg Oral Daily  . antiseptic oral rinse  7 mL Mouth Rinse BID  . aspirin EC  81 mg Oral Daily  . carvedilol  12.5 mg Oral BID WC  . cefTRIAXone (ROCEPHIN)  IV  1 g Intravenous Q24H  . gabapentin  200 mg Oral BID  . multivitamin with minerals  1 tablet Oral Daily  . sodium chloride flush  3 mL Intravenous Q12H    Assessment: Pharmacy consulted to dose and monitor heparin in this 75 year old male for ACS.  HL at 1240 on 5/28: 0.29  Goal of Therapy:  Heparin level 0.3-0.7 units/ml Monitor platelets by anticoagulation protocol: Yes  Plan:  Heparin level therapeutic, will continue current rate and recheck confirmatory level in 8 hours and CBC with AM labs.   Pharmacy will continue to follow.   Kass Herberger C, Pharm.D. Clinical Pharmacist 04/17/2016,8:00 AM

## 2016-04-17 NOTE — Care Management Important Message (Signed)
Important Message  Patient Details  Name: Nathan Figueroa MRN: [id number] Date of Birth: 09/28/1941   Medicare Important Message Given:  Yes    Olegario MessierKathy A Donia Yokum 04/17/2016, 11:10 AM

## 2016-04-17 NOTE — Progress Notes (Signed)
SUBJECTIVE: Patient is confused but denies any chest pain or shortness of breath   Filed Vitals:   04/16/16 1312 04/16/16 2115 04/17/16 0438 04/17/16 0900  BP: 118/87 106/80 104/73 118/89  Pulse: 110 115 127 130  Temp: 98.3 F (36.8 C) 100.2 F (37.9 C) 98.2 F (36.8 C) 98.8 F (37.1 C)  TempSrc: Oral Oral Oral Axillary  Resp: 20 18 17 22   Height:      Weight:      SpO2: 98% 98% 93% 93%    Intake/Output Summary (Last 24 hours) at 04/17/16 0912 Last data filed at 04/17/16 0433  Gross per 24 hour  Intake    360 ml  Output    450 ml  Net    -90 ml    LABS: Basic Metabolic Panel:  Recent Labs  16/08/9604/27/17 1007 04/16/16 0228  NA 145 145  K 3.5 3.6  CL 109 113*  CO2 23 23  GLUCOSE 181* 132*  BUN 22* 19  CREATININE 1.95* 1.81*  CALCIUM 8.9 8.0*   Liver Function Tests:  Recent Labs  04/15/16 1007  AST 25  ALT 16*  ALKPHOS 108  BILITOT 1.4*  PROT 8.8*  ALBUMIN 3.4*    Recent Labs  04/15/16 1007  LIPASE 19   CBC:  Recent Labs  04/15/16 1007 04/16/16 0228 04/17/16 0654  WBC 28.1* 18.4* 14.6*  NEUTROABS 23.0*  --   --   HGB 13.4 11.8* 11.6*  HCT 40.0 36.0* 35.0*  MCV 89.9 90.6 90.5  PLT 174 137* 138*   Cardiac Enzymes:  Recent Labs  04/15/16 1553 04/15/16 2207 04/16/16 0228  TROPONINI 1.38* 4.16* 7.92*   BNP: Invalid input(s): POCBNP D-Dimer: No results for input(s): DDIMER in the last 72 hours. Hemoglobin A1C: No results for input(s): HGBA1C in the last 72 hours. Fasting Lipid Panel: No results for input(s): CHOL, HDL, LDLCALC, TRIG, CHOLHDL, LDLDIRECT in the last 72 hours. Thyroid Function Tests: No results for input(s): TSH, T4TOTAL, T3FREE, THYROIDAB in the last 72 hours.  Invalid input(s): FREET3 Anemia Panel: No results for input(s): VITAMINB12, FOLATE, FERRITIN, TIBC, IRON, RETICCTPCT in the last 72 hours.   PHYSICAL EXAM General: Well developed, well nourished, in no acute distress HEENT:  Normocephalic and  atramatic Neck:  No JVD.  Lungs: Clear bilaterally to auscultation and percussion. Heart: HRRR . Normal S1 and S2 without gallops or murmurs.  Abdomen: Bowel sounds are positive, abdomen soft and non-tender  Msk:  Back normal, normal gait. Normal strength and tone for age. Extremities: No clubbing, cyanosis or edema.   Neuro: Alert and oriented X 3. Psych:  Good affect, responds appropriately  TELEMETRY: Patient is not on the monitor  ASSESSMENT AND PLAN: Mildly elevated troponin with LVEF of 35% on echocardiogram. Given the patient has dementia and altered other medical problems will treat the patient conservatively with medications. Patient is not a candidate for catheterization or other invasive procedure.  Active Problems:   Sepsis (HCC)    Adrian BlackwaterKHAN,Cristen Murcia A, MD, [facility]ACC 04/17/2016 9:12 AM

## 2016-04-17 NOTE — Evaluation (Signed)
Clinical/Bedside Swallow Evaluation Patient Details  Name: Nathan Figueroa MRN: [id number] Date of Birth: 06/02/1941  Today's Date: 04/17/2016 Time: SLP Start Time (ACUTE ONLY): 1210 SLP Stop Time (ACUTE ONLY): 1310 SLP Time Calculation (min) (ACUTE ONLY): 60 min  Past Medical History:  Past Medical History  Diagnosis Date  . Hypertension   . History of rhabdomyolysis   . Weakness   . Vitamin disease   . Muscle weakness (generalized)   . Special sensory attacks (HCC)   . Urinary tract infection, site not specified   . Anemia, unspecified   . Thiamin deficiency   . Acute bronchitis   . Epidemic vomiting syndrome   . Change of dressing   . Paroxysmal tachycardia, unspecified (HCC)    Past Surgical History: History reviewed. No pertinent past surgical history. HPI:  Pt is a 75 y.o. male with a known history of multiple medical issues including heavy alcohol abuse w/ withdrawal seizures, Cognitive impairment with cognitive communication deficits who is predominantly bedbound who presents from nursing facility where he resides due to altered mental status. Code sepsis was called in the emergency room. Patient received broad-spectrum antibiotics for fever and diaphoresis. Initially he was noted to have oxygen saturation of 88% on room air. He had a fever of 102 upon arrival. Pt is awake, speaks minimally, he follows basic commands. He communicated at this lunch meal that he did not want the food "again" but he stated "leave the ice cream".    Assessment / Plan / Recommendation Clinical Impression  Pt appeared to adequately tolerate trials of thin liquids and purees (but refused trials of soft solids) w/ no overt s/s of aspiration noted; no decline in respiratory status - vocal quality was clear b/t trials when assessed. Of note, pt exhibited min increased exhalation effort at baseline and during po trials/self feeding but this did not increase w/ the continuation of po trials. Pt refused  wearing the [place] O2 support until the Nurse came in the room. Oral phase was grossly wfl w/ the bolus trials given - oral motor movements for bolus management appeared adequate w/ trials given.  Pt assisted in feeding self but required assistance for tray setup and positioning in bed for safe oral intake. Due to pt's presentation at this time, he appears at reduced risk for aspiration following general aspiration precautions - including positioning/sitting fully upright for po intake. Pt instructed on aspiration precautions including small, single sips; eating and drinking slowly. Pt gave verbal acknowledgement. Recommend a Dys. 2 diet consistency w/ thin liquids; aspiration precautions; meds in Puree - whole as tolerates. Tray setup and positioning upright at all meals. Unsure of pt's baseline presentation w/ oral intake; if any previous concerns of dysphagia. Recommend monitoring for any changes in pulmonary status and f/u w/ MD.     Aspiration Risk   (reduced following precautions)    Diet Recommendation  Dysphagia 2 w/ thin liquids; aspiration precautions; Reflux precautions sec. To heavy ETOH abuse in past  Medication Administration: Whole meds with puree    Other  Recommendations Recommended Consults:  (Dietician - for preferences and/or supplements) Oral Care Recommendations: Oral care BID;Staff/trained caregiver to provide oral care   Follow up Recommendations  Skilled Nursing facility (TBD)    Frequency and Duration min 2x/week  1 week       Prognosis Prognosis for Safe Diet Advancement: Fair (-Good) Barriers to Reach Goals: Cognitive deficits;Behavior (missing most dentition)      Swallow Study   General Date  of Onset: 04/15/16 HPI: Pt is a 75 y.o. male with a known history of multiple medical issues including heavy alcohol abuse w/ withdrawal seizures, Cognitive impairment with cognitive communication deficits who is predominantly bedbound who presents from nursing facility where  he resides due to altered mental status. Code sepsis was called in the emergency room. Patient received broad-spectrum antibiotics for fever and diaphoresis. Initially he was noted to have oxygen saturation of 88% on room air. He had a fever of 102 upon arrival. Pt is awake, speaks minimally, he follows basic commands. He communicated at this lunch meal that he did not want the food "again" but he stated "leave the ice cream".  Type of Study: Bedside Swallow Evaluation Previous Swallow Assessment: none indicated Diet Prior to this Study: Dysphagia 2 (chopped);Thin liquids (at admission per MD) Temperature Spikes Noted: No (wbc 14.6 down from 18.4) Respiratory Status: Nasal cannula (2 liters down from 4 liters) History of Recent Intubation: No Behavior/Cognition: Alert;Distractible;Requires cueing;Agitated Oral Cavity Assessment:  (unable to assess sec. to Cognitive status; lips dry) Oral Care Completed by SLP:  (unable to complete ) Oral Cavity - Dentition: Missing dentition (most dentition missing) Vision: Functional for self-feeding Self-Feeding Abilities: Able to feed self;Needs assist;Needs set up Patient Positioning: Upright in bed (w/ encouragement) Baseline Vocal Quality: Low vocal intensity (gravely) Volitional Cough:  (did not perform) Volitional Swallow: Unable to elicit    Oral/Motor/Sensory Function Overall Oral Motor/Sensory Function:  (appeared grossly wfl for bolus management)   Ice Chips Ice chips: Not tested   Thin Liquid Thin Liquid: Within functional limits (grossly) Presentation: Cup;Self Fed;Straw (~4 ozs of tea and water total)    Nectar Thick Nectar Thick Liquid: Not tested   Honey Thick Honey Thick Liquid: Not tested   Puree Puree: Within functional limits (grossly) Presentation: Self Fed;Spoon (~4 ozs)   Solid   GO   Solid:  (refused trials)         Jerilynn Som, MS, CCC-SLP  Nathan Figueroa 04/17/2016,2:00 PM

## 2016-04-17 NOTE — Progress Notes (Signed)
Clinical Child psychotherapistocial Worker (CSW) confirmed with Jomarie LongsJoseph [facility] liaison that patient is a long term care resident at [facility] and can return to [facility] when medically stable. CSW will continue to follow and assist as needed.   Jetta LoutBailey Morgan, LCSW [phone]

## 2016-04-18 LAB — BASIC METABOLIC PANEL
ANION GAP: 9 (ref 5–15)
BUN: 22 mg/dL — ABNORMAL HIGH (ref 6–20)
CALCIUM: 8.1 mg/dL — AB (ref 8.9–10.3)
CO2: 23 mmol/L (ref 22–32)
Chloride: 110 mmol/L (ref 101–111)
Creatinine, Ser: 1.64 mg/dL — ABNORMAL HIGH (ref 0.61–1.24)
GFR, EST AFRICAN AMERICAN: 46 mL/min — AB (ref >60)
GFR, EST NON AFRICAN AMERICAN: 40 mL/min — AB (ref >60)
GLUCOSE: 117 mg/dL — AB (ref 65–99)
POTASSIUM: 4.1 mmol/L (ref 3.5–5.1)
Sodium: 142 mmol/L (ref 135–145)

## 2016-04-18 LAB — CBC
HEMATOCRIT: 33.1 % — AB (ref 40.0–52.0)
Hemoglobin: 11.2 g/dL — ABNORMAL LOW (ref 13.0–18.0)
MCH: 30.2 pg (ref 26.0–34.0)
MCHC: 33.7 g/dL (ref 32.0–36.0)
MCV: 89.6 fL (ref 80.0–100.0)
PLATELETS: 137 10*3/uL — AB (ref 150–440)
RBC: 3.7 MIL/uL — AB (ref 4.40–5.90)
RDW: 14.2 % (ref 11.5–14.5)
WBC: 15.2 10*3/uL — AB (ref 3.8–10.6)

## 2016-04-18 MED ORDER — VANCOMYCIN HCL IN DEXTROSE 1-5 GM/200ML-% IV SOLN
1000.0000 mg | INTRAVENOUS | Status: DC
  Administered 2016-04-18: 1000 mg via INTRAVENOUS
  Filled 2016-04-18 (×2): qty 200

## 2016-04-18 MED ORDER — VANCOMYCIN HCL IN DEXTROSE 1-5 GM/200ML-% IV SOLN
1000.0000 mg | Freq: Once | INTRAVENOUS | Status: AC
  Administered 2016-04-18: 15:00:00 1000 mg via INTRAVENOUS
  Filled 2016-04-18: qty 200

## 2016-04-18 MED ORDER — ENOXAPARIN SODIUM 40 MG/0.4ML ~~LOC~~ SOLN
40.0000 mg | SUBCUTANEOUS | Status: DC
  Administered 2016-04-18 – 2016-04-19 (×2): 40 mg via SUBCUTANEOUS
  Filled 2016-04-18 (×2): qty 0.4

## 2016-04-18 NOTE — Progress Notes (Signed)
Initial Nutrition Assessment  DOCUMENTATION CODES:   Not applicable  INTERVENTION:   Cater to pt preferences on dysphagia II diet order, such as 2 sweet teas at lunch; SLP following Recommend Mighty Shakes at breakfast and Magic Cup BID for added nutrition (each supplement provides approximately 300kcals and 9g protein)    NUTRITION DIAGNOSIS:   Inadequate oral intake related to acute illness as evidenced by meal completion < 50%.  GOAL:   Patient will meet greater than or equal to 90% of their needs  MONITOR:   PO intake, Supplement acceptance, Weight trends, Labs, I & O's, Skin  REASON FOR ASSESSMENT:   Consult Poor PO  ASSESSMENT:   Pt with h/o cognitive communication deficit and bedbound at baseline admitted from [facility] with altered mental status due to sepsis from urinary tract infection and HCAP.  Past Medical History  Diagnosis Date  . Hypertension   . History of rhabdomyolysis   . Weakness   . Vitamin disease   . Muscle weakness (generalized)   . Special sensory attacks (HCC)   . Urinary tract infection, site not specified   . Anemia, unspecified   . Thiamin deficiency   . Acute bronchitis   . Epidemic vomiting syndrome   . Change of dressing   . Paroxysmal tachycardia, unspecified (HCC)     Diet Order:  DIET DYS 2 Room service appropriate?: Yes with Assist; Fluid consistency:: Thin   Pt only grunted at RD on visit. Per Nsg staff pt had been calling out most of the morning. Per CNA pt is an assist but not a feeder. Per SLP, pt likes ice cream but unable to clarify much else.   Per documentation pt ate 0% of dinner last night, and 0-25% of meals since admission.  Medications: MVI  Electrolyte/Renal Profile and Glucose Profile:   Recent Labs Lab 04/15/16 1007 04/16/16 0228 04/18/16 0346  NA 145 145 142  K 3.5 3.6 4.1  CL 109 113* 110  CO2 23 23 23   BUN 22* 19 22*  CREATININE 1.95* 1.81* 1.64*  CALCIUM 8.9 8.0* 8.1*  GLUCOSE 181*  132* 117*    Gastrointestinal Profile: Last BM:  04/17/2016   Nutrition-Focused Physical Exam Findings:  Unable to complete Nutrition-Focused physical exam at this time. Per Nsg staff, pt does not like or wants to be touched at all.    Weight Change: No trend per [facility] weight encounters, unable to clarify with pt   Skin:  Reviewed, no issues  Height:   Ht Readings from Last 1 Encounters:  04/15/16 5\' 11"  (1.803 m)    Weight:   Wt Readings from Last 1 Encounters:  04/15/16 207 lb 9.6 oz (94.167 kg)     BMI:  Body mass index is 28.97 kg/(m^2).  Estimated Nutritional Needs:   Kcal:  1950-2340kcals  Protein:  86-101g protein  Fluid:  >/= 2L fluid  EDUCATION NEEDS:   Education needs no appropriate at this time  Leda QuailAllyson Johnnette Laux, RD, LDN Pager [phone] Weekend/On-Call Pager [phone]

## 2016-04-18 NOTE — Progress Notes (Signed)
Patient ID: Nathan Figueroa, male   DOB: June 26, 1941, 75 y.o.   MRN: [id number] [facility] PROGRESS NOTE  Nathan Figueroa NWG:[id number] DOB: 16-Feb-1941 DOA: 04/15/2016 PCP: Dorothey Baseman, MD  HPI/Subjective: Patient states he feels so-so but can't elaborate. Answers no to all my questions.  Objective: Filed Vitals:   04/18/16 0510 04/18/16 1314  BP: 109/72 104/79  Pulse: 92 84  Temp: 98.8 F (37.1 C) 97.6 F (36.4 C)  Resp: 24 22    Filed Weights   04/15/16 1022 04/15/16 1700  Weight: 101 kg (222 lb 10.6 oz) 94.167 kg (207 lb 9.6 oz)    ROS: Review of Systems  Constitutional: Negative for fever and chills.  Eyes: Negative for blurred vision.  Respiratory: Negative for cough and shortness of breath.   Cardiovascular: Negative for chest pain.  Gastrointestinal: Negative for nausea, vomiting, abdominal pain, diarrhea and constipation.  Genitourinary: Negative for dysuria.  Musculoskeletal: Positive for joint pain.  Neurological: Negative for dizziness and headaches.   Exam: Physical Exam  HENT:  Nose: No mucosal edema.  Mouth/Throat: No oropharyngeal exudate or posterior oropharyngeal edema.  Eyes: Conjunctivae and lids are normal. Pupils are equal, round, and reactive to light.  Had to open his left eyelid for him but that he was able to hold it up a little bit.  Neck: No JVD present. Carotid bruit is not present. No edema present. No thyroid mass and no thyromegaly present.  Cardiovascular: S1 normal and S2 normal.  Exam reveals no gallop.   No murmur heard. Pulses:      Dorsalis pedis pulses are 2+ on the right side, and 2+ on the left side.  Respiratory: No respiratory distress. He has no wheezes. He has rhonchi in the right lower field. He has no rales.  GI: Soft. Bowel sounds are normal. There is no tenderness.  Musculoskeletal:       Right ankle: He exhibits swelling.       Left ankle: He exhibits swelling.  Lymphadenopathy:    He has no cervical  adenopathy.  Neurological: He is alert.  Skin: Skin is warm. No rash noted. Nails show no clubbing.  Psychiatric: He has a normal mood and affect.      Data Reviewed: Basic Metabolic Panel:  Recent Labs Lab 04/15/16 1007 04/16/16 0228 04/18/16 0346  NA 145 145 142  K 3.5 3.6 4.1  CL 109 113* 110  CO2 GLUCOSE 181* 132* 117*  BUN 22* 19 22*  CREATININE 1.95* 1.81* 1.64*  CALCIUM 8.9 8.0* 8.1*   Liver Function Tests:  Recent Labs Lab 04/15/16 1007  AST 25  ALT 16*  ALKPHOS 108  BILITOT 1.4*  PROT 8.8*  ALBUMIN 3.4*    Recent Labs Lab 04/15/16 1007  LIPASE 19   CBC:  Recent Labs Lab 04/15/16 1007 04/16/16 0228 04/17/16 0654 04/18/16 0346  WBC 28.1* 18.4* 14.6* 15.2*  NEUTROABS 23.0*  --   --   --   HGB 13.4 11.8* 11.6* 11.2*  HCT 40.0 36.0* 35.0* 33.1*  MCV 89.9 90.6 90.5 89.6  PLT 174 137* 138* 137*   Cardiac Enzymes:  Recent Labs Lab 04/15/16 1007 04/15/16 1553 04/15/16 2207 04/16/16 0228  TROPONINI 0.14* 1.38* 4.16* 7.92*     Recent Results (from the past 240 hour(s))  Blood Culture (routine x 2)     Status: None (Preliminary result)   Collection Time: 04/15/16 10:07 AM  Result Value Ref Range Status   Specimen Description  BLOOD LEFT WRIST  Final   Special Requests BOTTLES DRAWN AEROBIC AND ANAEROBIC  5CC  Final   Culture NO GROWTH 3 DAYS  Final   Report Status PENDING  Incomplete  Blood Culture (routine x 2)     Status: Abnormal (Preliminary result)   Collection Time: 04/15/16 10:07 AM  Result Value Ref Range Status   Specimen Description BLOOD RIGHT WRIST  Final   Special Requests BOTTLES DRAWN AEROBIC AND ANAEROBIC  2CC  Final   Culture  Setup Time   Final    GRAM POSITIVE COCCI IN BOTH AEROBIC AND ANAEROBIC BOTTLES CRITICAL RESULT CALLED TO, Nathan BACK BY AND VERIFIED WITH: NATE COOKSON AT [id number] 04/16/16.PMH CONFIRMED BY [facility]    Culture (A)  Final    STAPHYLOCOCCUS SPECIES IN BOTH AEROBIC AND ANAEROBIC  BOTTLES SUSCEPTIBILITIES TO FOLLOW    Report Status PENDING  Incomplete  Urine culture     Status: Abnormal   Collection Time: 04/15/16 10:07 AM  Result Value Ref Range Status   Specimen Description URINE, RANDOM  Final   Special Requests NONE  Final   Culture (A)  Final    <10,000 COLONIES/mL INSIGNIFICANT GROWTH [facility]    Report Status 04/16/2016 FINAL  Final  Blood Culture ID Panel (Reflexed)     Status: Abnormal   Collection Time: 04/15/16 10:07 AM  Result Value Ref Range Status   Enterococcus species NOT DETECTED NOT DETECTED Final   Vancomycin resistance NOT DETECTED NOT DETECTED Final   Listeria monocytogenes NOT DETECTED NOT DETECTED Final   Staphylococcus species DETECTED (A) NOT DETECTED Final    Comment: CRITICAL RESULT CALLED TO, Nathan BACK BY AND VERIFIED WITH: NATE COOKSON AT [phone] 04/16/16.PMH    Staphylococcus aureus NOT DETECTED NOT DETECTED Final   Methicillin resistance DETECTED (A) NOT DETECTED Final    Comment: CRITICAL RESULT CALLED TO, Nathan BACK BY AND VERIFIED WITH: NATE COOKSON AT [phone] 04/16/16.PMH    Streptococcus species NOT DETECTED NOT DETECTED Final   Streptococcus agalactiae NOT DETECTED NOT DETECTED Final   Streptococcus pneumoniae NOT DETECTED NOT DETECTED Final   Streptococcus pyogenes NOT DETECTED NOT DETECTED Final   Acinetobacter baumannii NOT DETECTED NOT DETECTED Final   Enterobacteriaceae species NOT DETECTED NOT DETECTED Final   Enterobacter cloacae complex NOT DETECTED NOT DETECTED Final   Escherichia coli NOT DETECTED NOT DETECTED Final   Klebsiella oxytoca NOT DETECTED NOT DETECTED Final   Klebsiella pneumoniae NOT DETECTED NOT DETECTED Final   Proteus species NOT DETECTED NOT DETECTED Final   Serratia marcescens NOT DETECTED NOT DETECTED Final   Carbapenem resistance NOT DETECTED NOT DETECTED Final   Haemophilus influenzae NOT DETECTED NOT DETECTED Final   Neisseria meningitidis NOT DETECTED NOT DETECTED  Final   Pseudomonas aeruginosa NOT DETECTED NOT DETECTED Final   Candida albicans NOT DETECTED NOT DETECTED Final   Candida glabrata NOT DETECTED NOT DETECTED Final   Candida krusei NOT DETECTED NOT DETECTED Final   Candida parapsilosis NOT DETECTED NOT DETECTED Final   Candida tropicalis NOT DETECTED NOT DETECTED Final  MRSA PCR Screening     Status: None   Collection Time: 04/15/16  2:25 PM  Result Value Ref Range Status   MRSA by PCR NEGATIVE NEGATIVE Final    Comment:        The GeneXpert MRSA Assay (FDA approved for NASAL specimens only), is one component of a comprehensive MRSA colonization surveillance program. It is not intended to diagnose MRSA infection nor to guide  or monitor treatment for MRSA infections.       Scheduled Meds: . antiseptic oral rinse  7 mL Mouth Rinse BID  . aspirin EC  81 mg Oral Daily  . carvedilol  12.5 mg Oral BID WC  . diltiazem  120 mg Oral Daily  . enoxaparin (LOVENOX) injection  40 mg Subcutaneous Q24H  . gabapentin  200 mg Oral BID  . multivitamin with minerals  1 tablet Oral Daily  . sodium chloride flush  3 mL Intravenous Q12H  . vancomycin  1,000 mg Intravenous Q18H    Assessment/Plan:  1. Clinical sepsis with acute encephalopathy.  Today second blood culture came back positive with staph species. Restart vancomycin and discontinue Rocephin. Repeat blood cultures tomorrow morning. Patient still having fever on a nightly basis but now low-grade. White count still elevated but trended better. 2. Non-STEMI. Continue aspirin and Coreg. Seen in consultation by cardiology. Likely medical management. With history of rhabdomyolysis I am hesitant on statin at this point. 3. Acute kidney injury.  Creatinine trending better and now down to 1.64. Hold nephrotoxic medications. 4. Essential hypertension and tachycardia. Start Cardizem CD and continue Coreg. 5. Weakness. Will need to follow-up repeat cultures and make sure they're negative prior to  disposition. 6. Anemia. Continue to monitor  Code Status:     Code Status Orders        Start     Ordered   04/15/16 1402  Full code   Continuous     04/15/16 1401    Code Status History    Date Active Date Inactive Code Status Order ID Comments User Context   This patient has a current code status but no historical code status.    Advance Directive Documentation        Most Recent Value   Type of Advance Directive  Out of facility DNR (pink MOST or yellow form)   Pre-existing out of facility DNR order (yellow form or pink MOST form)  Pink MOST form placed in chart (order not valid for inpatient use)   "MOST" Form in Place?       Disposition Plan: Back to [facility] When repeat cultures are negative. Spoke with sister on the phone.  Consultants: Cardiology  Antibiotics:  Vancomycin  Time spent: 25 minutes  Alford Highland  [employer]

## 2016-04-18 NOTE — Progress Notes (Signed)
SUBJECTIVE: comfortable   Filed Vitals:   04/17/16 1230 04/17/16 1831 04/17/16 2121 04/18/16 0510  BP:  110/75 108/79 109/72  Pulse:  94 105 92  Temp:   100 F (37.8 C) 98.8 F (37.1 C)  TempSrc:   Oral Oral  Resp:  23 26 24   Height:      Weight:      SpO2: 94% 92% 91% 95%    Intake/Output Summary (Last 24 hours) at 04/18/16 0835 Last data filed at 04/18/16 0514  Gross per 24 hour  Intake      0 ml  Output   1200 ml  Net  -1200 ml    LABS: Basic Metabolic Panel:  Recent Labs  16/08/9604/28/17 0228 04/18/16 0346  NA 145 142  K 3.6 4.1  CL 113* 110  CO2 23 23  GLUCOSE 132* 117*  BUN 19 22*  CREATININE 1.81* 1.64*  CALCIUM 8.0* 8.1*   Liver Function Tests:  Recent Labs  04/15/16 1007  AST 25  ALT 16*  ALKPHOS 108  BILITOT 1.4*  PROT 8.8*  ALBUMIN 3.4*    Recent Labs  04/15/16 1007  LIPASE 19   CBC:  Recent Labs  04/15/16 1007  04/17/16 0654 04/18/16 0346  WBC 28.1*  < > 14.6* 15.2*  NEUTROABS 23.0*  --   --   --   HGB 13.4  < > 11.6* 11.2*  HCT 40.0  < > 35.0* 33.1*  MCV 89.9  < > 90.5 89.6  PLT 174  < > 138* 137*  < > = values in this interval not displayed. Cardiac Enzymes:  Recent Labs  04/15/16 1553 04/15/16 2207 04/16/16 0228  TROPONINI 1.38* 4.16* 7.92*   BNP: Invalid input(s): POCBNP D-Dimer: No results for input(s): DDIMER in the last 72 hours. Hemoglobin A1C: No results for input(s): HGBA1C in the last 72 hours. Fasting Lipid Panel: No results for input(s): CHOL, HDL, LDLCALC, TRIG, CHOLHDL, LDLDIRECT in the last 72 hours. Thyroid Function Tests: No results for input(s): TSH, T4TOTAL, T3FREE, THYROIDAB in the last 72 hours.  Invalid input(s): FREET3 Anemia Panel: No results for input(s): VITAMINB12, FOLATE, FERRITIN, TIBC, IRON, RETICCTPCT in the last 72 hours.   PHYSICAL EXAM General: Well developed, well nourished, in no acute distress HEENT:  Normocephalic and atramatic Neck:  No JVD.  Lungs: Clear bilaterally to  auscultation and percussion. Heart: HRRR . Normal S1 and S2 without gallops or murmurs.  Abdomen: Bowel sounds are positive, abdomen soft and non-tender  Msk:  Back normal, normal gait. Normal strength and tone for age. Extremities: No clubbing, cyanosis or edema.   Neuro: Alert and oriented X 3. Psych:  Good affect, responds appropriately  TELEMETRY: no on monitorASSESSMENT AND PLAN:Mildly elevated troponin status posts confusional episode and history of rhabdomyolysis . Not a candidate for cath. ASSESSMENT AND PLAN:   Active Problems:   Sepsis (HCC)    Laurier NancyKHAN,Kiyara Bouffard A, MD, [facility] 04/18/2016 8:35 AM

## 2016-04-18 NOTE — Progress Notes (Signed)
Pt repositioned frequently during shift. Pt has repeatedly taken off O2, Pt requires one assist with feeding. No s/s of acute distress. No c/o pain or discomfort.

## 2016-04-18 NOTE — Progress Notes (Signed)
Pharmacy Antibiotic Note  Nathan Figueroa is a 75 y.o. male admitted on 04/15/2016 with bacteremia.  Pharmacy has been consulted for vancomycin dosing.  Plan: Pharmacy consulted to dose Vancomycin in a 75 yo male for bacteremia.  Patient previously received 1 dose of Vancomycin on 5/28.  Will restart patient on Vancomycin 1 gm IV q18h .  Will give a stacked dose ~6 hours after first dose.   Ke: 0.043, t1/2: 16.1 h, Vd: 58.1 L  Will order trough prior to 5th dose on 6/2 at 1830 (should be at steady state).  Height:  (180.3 cm) Weight: 207 lb 9.6 oz (94.167 kg) IBW/kg (Calculated) : 75.3  Temp (24hrs), Avg:99.4 F (37.4 C), Min:98.8 F (37.1 C), Max:100 F (37.8 C)   Recent Labs Lab 04/15/16 1007 04/16/16 0228 04/17/16 0654 04/18/16 0346  WBC 28.1* 18.4* 14.6* 15.2*  CREATININE 1.95* 1.81*  --  1.64*  LATICACIDVEN 1.5  --   --   --     Estimated Creatinine Clearance: 47 mL/min (by C-G formula based on Cr of 1.64).    No Known Allergies  Antimicrobials this admission: Vancomycin 5/28>> 5/28 Zosyn 5/27: one time dose Ceftriaxone 5/28>>5/30 Vancomycin 5/30>>  Dose adjustments this admission: Trough prior to dose on 6/2 at 1830  Microbiology results: Results for orders placed or performed during the hospital encounter of 04/15/16  Blood Culture (routine x 2)     Status: None (Preliminary result)   Collection Time: 04/15/16 10:07 AM  Result Value Ref Range Status   Specimen Description BLOOD LEFT WRIST  Final   Special Requests BOTTLES DRAWN AEROBIC AND ANAEROBIC  5CC  Final   Culture NO GROWTH 3 DAYS  Final   Report Status PENDING  Incomplete  Blood Culture (routine x 2)     Status: Abnormal (Preliminary result)   Collection Time: 04/15/16 10:07 AM  Result Value Ref Range Status   Specimen Description BLOOD RIGHT WRIST  Final   Special Requests BOTTLES DRAWN AEROBIC AND ANAEROBIC  2CC  Final   Culture  Setup Time   Final    GRAM POSITIVE COCCI IN BOTH  AEROBIC AND ANAEROBIC BOTTLES CRITICAL RESULT CALLED TO, READ BACK BY AND VERIFIED WITH: NATE COOKSON AT [id number] 04/16/16.PMH CONFIRMED BY [facility]    Culture (A)  Final    STAPHYLOCOCCUS SPECIES IN BOTH AEROBIC AND ANAEROBIC BOTTLES SUSCEPTIBILITIES TO FOLLOW    Report Status PENDING  Incomplete  Urine culture     Status: Abnormal   Collection Time: 04/15/16 10:07 AM  Result Value Ref Range Status   Specimen Description URINE, RANDOM  Final   Special Requests NONE  Final   Culture (A)  Final    <10,000 COLONIES/mL INSIGNIFICANT GROWTH Performed at [facility]    Report Status 04/16/2016 FINAL  Final  Blood Culture ID Panel (Reflexed)     Status: Abnormal   Collection Time: 04/15/16 10:07 AM  Result Value Ref Range Status   Enterococcus species NOT DETECTED NOT DETECTED Final   Vancomycin resistance NOT DETECTED NOT DETECTED Final   Listeria monocytogenes NOT DETECTED NOT DETECTED Final   Staphylococcus species DETECTED (A) NOT DETECTED Final    Comment: CRITICAL RESULT CALLED TO, READ BACK BY AND VERIFIED WITH: NATE COOKSON AT [id number] 04/16/16.PMH    Staphylococcus aureus NOT DETECTED NOT DETECTED Final   Methicillin resistance DETECTED (A) NOT DETECTED Final    Comment: CRITICAL RESULT CALLED TO, READ BACK BY AND VERIFIED WITH: NATE COOKSON AT 0865 04/16/16.PMH  Streptococcus species NOT DETECTED NOT DETECTED Final   Streptococcus agalactiae NOT DETECTED NOT DETECTED Final   Streptococcus pneumoniae NOT DETECTED NOT DETECTED Final   Streptococcus pyogenes NOT DETECTED NOT DETECTED Final   Acinetobacter baumannii NOT DETECTED NOT DETECTED Final   Enterobacteriaceae species NOT DETECTED NOT DETECTED Final   Enterobacter cloacae complex NOT DETECTED NOT DETECTED Final   Escherichia coli NOT DETECTED NOT DETECTED Final   Klebsiella oxytoca NOT DETECTED NOT DETECTED Final   Klebsiella pneumoniae NOT DETECTED NOT DETECTED Final   Proteus species NOT DETECTED NOT DETECTED Final    Serratia marcescens NOT DETECTED NOT DETECTED Final   Carbapenem resistance NOT DETECTED NOT DETECTED Final   Haemophilus influenzae NOT DETECTED NOT DETECTED Final   Neisseria meningitidis NOT DETECTED NOT DETECTED Final   Pseudomonas aeruginosa NOT DETECTED NOT DETECTED Final   Candida albicans NOT DETECTED NOT DETECTED Final   Candida glabrata NOT DETECTED NOT DETECTED Final   Candida krusei NOT DETECTED NOT DETECTED Final   Candida parapsilosis NOT DETECTED NOT DETECTED Final   Candida tropicalis NOT DETECTED NOT DETECTED Final  MRSA PCR Screening     Status: None   Collection Time: 04/15/16  2:25 PM  Result Value Ref Range Status   MRSA by PCR NEGATIVE NEGATIVE Final    Comment:        The GeneXpert MRSA Assay (FDA approved for NASAL specimens only), is one component of a comprehensive MRSA colonization surveillance program. It is not intended to diagnose MRSA infection nor to guide or monitor treatment for MRSA infections.      Thank you for allowing pharmacy to be a part of this patient's care.  Sydelle Sherfield G 04/18/2016 12:47 PM

## 2016-04-19 LAB — BASIC METABOLIC PANEL
Anion gap: 7 (ref 5–15)
BUN: 22 mg/dL — ABNORMAL HIGH (ref 6–20)
CHLORIDE: 112 mmol/L — AB (ref 101–111)
CO2: 25 mmol/L (ref 22–32)
CREATININE: 1.54 mg/dL — AB (ref 0.61–1.24)
Calcium: 8.4 mg/dL — ABNORMAL LOW (ref 8.9–10.3)
GFR calc non Af Amer: 43 mL/min — ABNORMAL LOW (ref >60)
GFR, EST AFRICAN AMERICAN: 50 mL/min — AB (ref >60)
Glucose, Bld: 118 mg/dL — ABNORMAL HIGH (ref 65–99)
POTASSIUM: 3.7 mmol/L (ref 3.5–5.1)
SODIUM: 144 mmol/L (ref 135–145)

## 2016-04-19 LAB — URINALYSIS COMPLETE WITH MICROSCOPIC (ARMC ONLY)
BILIRUBIN URINE: NEGATIVE
GLUCOSE, UA: NEGATIVE mg/dL
Ketones, ur: NEGATIVE mg/dL
Nitrite: NEGATIVE
Protein, ur: 30 mg/dL — AB
Specific Gravity, Urine: 1.018 (ref 1.005–1.030)
pH: 5 (ref 5.0–8.0)

## 2016-04-19 LAB — CBC
HEMATOCRIT: 34.3 % — AB (ref 40.0–52.0)
HEMOGLOBIN: 11.4 g/dL — AB (ref 13.0–18.0)
MCH: 30.2 pg (ref 26.0–34.0)
MCHC: 33.3 g/dL (ref 32.0–36.0)
MCV: 90.9 fL (ref 80.0–100.0)
Platelets: 146 10*3/uL — ABNORMAL LOW (ref 150–440)
RBC: 3.78 MIL/uL — AB (ref 4.40–5.90)
RDW: 14.4 % (ref 11.5–14.5)
WBC: 12.9 10*3/uL — ABNORMAL HIGH (ref 3.8–10.6)

## 2016-04-19 LAB — GLUCOSE, CAPILLARY: GLUCOSE-CAPILLARY: 149 mg/dL — AB (ref 65–99)

## 2016-04-19 MED ORDER — DEXTROSE 5 % IV SOLN
2.0000 g | INTRAVENOUS | Status: DC
  Filled 2016-04-19: qty 2

## 2016-04-19 MED ORDER — NYSTATIN 100000 UNIT/ML MT SUSP
5.0000 mL | Freq: Four times a day (QID) | OROMUCOSAL | Status: DC
  Administered 2016-04-19 – 2016-04-20 (×5): 500000 [IU] via ORAL
  Filled 2016-04-19 (×5): qty 5

## 2016-04-19 MED ORDER — DEXTROSE 5 % IV SOLN
1.0000 g | Freq: Once | INTRAVENOUS | Status: AC
  Administered 2016-04-19: 1 g via INTRAVENOUS
  Filled 2016-04-19: qty 10

## 2016-04-19 MED ORDER — DEXTROSE 5 % IV SOLN
1.0000 g | INTRAVENOUS | Status: DC
  Administered 2016-04-19: 1 g via INTRAVENOUS
  Filled 2016-04-19: qty 10

## 2016-04-19 MED ORDER — VANCOMYCIN HCL 10 G IV SOLR
1250.0000 mg | INTRAVENOUS | Status: DC
  Administered 2016-04-19: 14:00:00 1250 mg via INTRAVENOUS
  Filled 2016-04-19: qty 1250

## 2016-04-19 NOTE — Progress Notes (Signed)
Pharmacy Antibiotic Note  Nathan NeighboursRalph L Figueroa is a 75 y.o. male admitted on 04/15/2016 with UTI/bactermia.    Patient on vancomycin for staph epi in blood cultures. Per ID likely to be contaminant, changing back to ceftriaxone to cover UTI.  Plan: Ceftriaxone 1gm IV Q24H  Height: 5\' 11"  (180.3 cm) Weight: 207 lb 9.6 oz (94.167 kg) IBW/kg (Calculated) : 75.3  Temp (24hrs), Avg:98.8 F (37.1 C), Min:97.8 F (36.6 C), Max:99.8 F (37.7 C)   Recent Labs Lab 04/15/16 1007 04/16/16 0228 04/17/16 0654 04/18/16 0346 04/19/16 0547  WBC 28.1* 18.4* 14.6* 15.2* 12.9*  CREATININE 1.95* 1.81*  --  1.64* 1.54*  LATICACIDVEN 1.5  --   --   --   --     Estimated Creatinine Clearance: 50.1 mL/min (by C-G formula based on Cr of 1.54).    No Known Allergies  Antimicrobials this admission: Vancomycin 5/28>> 5/28 Zosyn 5/27: one time dose Ceftriaxone 5/28>>5/30, resumed 5/31 Vancomycin 5/30>>5/31  Dose adjustments this admission: 5/31 Vanc 1gm Q18H > 1250 Q18H Trough prior to dose on 6/2 at 1830  Microbiology results: 5/31 Blood x 2 pending 5/27 Blood x 2 - staph species, methicillin resistance detected x2 ID, sens pending 5/27 Urine < 100k 5/27 MRSA PCR negative  Thank you for allowing pharmacy to be a part of this patient's care.  Stefanos Haynesworth C 04/19/2016 3:05 PM

## 2016-04-19 NOTE — Consult Note (Signed)
[facility] Infectious Disease     Reason for Consult:GPC bacteremia   Referring Physician: Karlton Lemon Date of Admission:  04/15/2016   Active Problems:   Sepsis [facility])   HPI: Nathan Figueroa is a 75 y.o. male admitted 5/27 with AMS.  He was found to be febrile on admit with T 103 and wbc 28 as well as hypoxia. Started on broad spectrum zosyn and vanco. W/u revealed 1/2 sets of bcx + Staph species. UA had TNTC wbc but ucx with < 10 k growth. CXR showed no PNA but possible acute bronchitis. He has been changed to CTx and vanco. WBC declined to 12 and no fevers since 5/27.  Very little history obtained from patient, answers yes or no but not really verbalizing.  He has a hx of Cognitive impairment and is largely bedbound at a NH.   Past Medical History  Diagnosis Date  . Hypertension   . History of rhabdomyolysis   . Weakness   . Vitamin disease   . Muscle weakness (generalized)   . 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 Urinary tract infection, site not specified   . Anemia, unspecified   . Thiamin deficiency   . Acute bronchitis   . Epidemic vomiting syndrome   . Change of dressing   . Paroxysmal tachycardia, unspecified ([place])    History reviewed. No pertinent past surgical history. Social History  Substance Use Topics  . Smoking status: Never Smoker   . Smokeless tobacco: None  . Alcohol Use: None   History reviewed. No pertinent family history.  Allergies: No Known Allergies  Current antibiotics: Antibiotics Given (last 72 hours)    Date/Time Action Medication Dose Rate   04/17/16 0917 Given   cefTRIAXone (ROCEPHIN) 1 g in dextrose 5 % 50 mL IVPB 1 g 100 mL/hr   04/18/16 1103 Given   cefTRIAXone (ROCEPHIN) 1 g in dextrose 5 % 50 mL IVPB 1 g 100 mL/hr   04/18/16 1442 Given   vancomycin (VANCOCIN) IVPB 1000 mg/200 mL premix 1,000 mg 200 mL/hr   04/18/16 2031 Given   vancomycin (VANCOCIN) IVPB 1000 mg/200 mL premix 1,000 mg 200 mL/hr   04/19/16 1401 Given   vancomycin (VANCOCIN) 1,250 mg in sodium chloride 0.9 % 250 mL IVPB 1,250 mg 166.7 mL/hr      MEDICATIONS: . antiseptic oral rinse  7 mL Mouth Rinse BID  . aspirin EC  81 mg Oral Daily  . carvedilol  12.5 mg Oral BID WC  . diltiazem  120 mg Oral Daily  . enoxaparin (LOVENOX) injection  40 mg Subcutaneous Q24H  . gabapentin  200 mg Oral BID  . multivitamin with minerals  1 tablet Oral Daily  . sodium chloride flush  3 mL Intravenous Q12H  . vancomycin  1,250 mg Intravenous Q18H    Review of Systems - unable to obtain  OBJECTIVE: Temp:  [98.7 F (37.1 C)-99.8 F (37.7 C)] 98.7 F (37.1 C) (05/31 1014) Pulse Rate:  [104-116] 109 (05/31 0844) Resp:  [16-18] 18 (05/31 0457) BP: (105-122)/(70-85) 105/85 mmHg (05/31 0844) SpO2:  [90 %-96 %] 96 % (05/31 1014) Constitutional: He is chronically ill appearing,  HENT: anicteric  Head: Normocephalic.  Eyes: No scleral icterus.  Neck: Neck supple. No JVD present. .  Cardiovascular: Normal rate, regular rhythm and normal heart sounds.  Pulmonary/Chest: Effort normal and breath sounds normal. No respiratory distress. He has no wheezes. He has no rales. He exhibits no tenderness.  Abdominal: Soft. Bowel sounds  are normal. He exhibits no distension and no mass. There is no tenderness. There is no rebound and no guarding.  Musculoskeletal: He exhibits no edema.  Neurological: He is alert but does not really verbalize Skin: Skin is warm. No rash noted. No erythema. Has  thick scaly skin on feet but no ulcers or wounds noted GU - foley in place with sediment in urine  LABS: Results for orders placed or performed during the hospital encounter of 04/15/16 (from the past 48 hour(s))  CBC     Status: Abnormal   Collection Time: 04/18/16  3:46 AM  Result Value Ref Range   WBC 15.2 (H) 3.8 - 10.6 K/uL   RBC 3.70 (L) 4.40 - 5.90 MIL/uL   Hemoglobin 11.2 (L) 13.0 - 18.0 g/dL   HCT 33.1 (L) 40.0 - 52.0 %   MCV 89.6 80.0 - 100.0 fL   MCH 30.2  26.0 - 34.0 pg   MCHC 33.7 32.0 - 36.0 g/dL   RDW 14.2 11.5 - 14.5 %   Platelets 137 (L) 150 - 440 K/uL  Basic metabolic panel     Status: Abnormal   Collection Time: 04/18/16  3:46 AM  Result Value Ref Range   Sodium 142 135 - 145 mmol/L   Potassium 4.1 3.5 - 5.1 mmol/L   Chloride 110 101 - 111 mmol/L   CO2 23 22 - 32 mmol/L   Glucose, Bld 117 (H) 65 - 99 mg/dL   BUN 22 (H) 6 - 20 mg/dL   Creatinine, Ser 1.64 (H) 0.61 - 1.24 mg/dL   Calcium 8.1 (L) 8.9 - 10.3 mg/dL   GFR calc non Af Amer 40 (L) >60 mL/min   GFR calc Af Amer 46 (L) >60 mL/min    Comment: (NOTE) The eGFR has been calculated using the CKD EPI equation. This calculation has not been validated in all clinical situations. eGFR's persistently <60 mL/min signify possible Chronic Kidney Disease.    Anion gap 9 5 - 15  CULTURE, BLOOD (ROUTINE X 2) w Reflex to ID Panel     Status: None (Preliminary result)   Collection Time: 04/19/16  5:40 AM  Result Value Ref Range   Specimen Description BLOOD LEFT HAND    Special Requests BOTTLES DRAWN AEROBIC AND ANAEROBIC 10ML    Culture NO GROWTH < 12 HOURS    Report Status PENDING   CBC     Status: Abnormal   Collection Time: 04/19/16  5:47 AM  Result Value Ref Range   WBC 12.9 (H) 3.8 - 10.6 K/uL   RBC 3.78 (L) 4.40 - 5.90 MIL/uL   Hemoglobin 11.4 (L) 13.0 - 18.0 g/dL   HCT 34.3 (L) 40.0 - 52.0 %   MCV 90.9 80.0 - 100.0 fL   MCH 30.2 26.0 - 34.0 pg   MCHC 33.3 32.0 - 36.0 g/dL   RDW 14.4 11.5 - 14.5 %   Platelets 146 (L) 150 - 440 K/uL  Basic metabolic panel     Status: Abnormal   Collection Time: 04/19/16  5:47 AM  Result Value Ref Range   Sodium 144 135 - 145 mmol/L   Potassium 3.7 3.5 - 5.1 mmol/L   Chloride 112 (H) 101 - 111 mmol/L   CO2 25 22 - 32 mmol/L   Glucose, Bld 118 (H) 65 - 99 mg/dL   BUN 22 (H) 6 - 20 mg/dL   Creatinine, Ser 1.54 (H) 0.61 - 1.24 mg/dL   Calcium 8.4 (L) 8.9 - 10.3 mg/dL  GFR calc non Af Amer 43 (L) >60 mL/min   GFR calc Af Amer 50 (L)  >60 mL/min    Comment: (NOTE) The eGFR has been calculated using the CKD EPI equation. This calculation has not been validated in all clinical situations. eGFR's persistently <60 mL/min signify possible Chronic Kidney Disease.    Anion gap 7 5 - 15  CULTURE, BLOOD (ROUTINE X 2) w Reflex to ID Panel     Status: None (Preliminary result)   Collection Time: 04/19/16  5:47 AM  Result Value Ref Range   Specimen Description BLOOD LEFT HAND    Special Requests BOTTLES DRAWN AEROBIC AND ANAEROBIC 10ML    Culture NO GROWTH < 12 HOURS    Report Status PENDING   Glucose, capillary     Status: Abnormal   Collection Time: 04/19/16 10:14 AM  Result Value Ref Range   Glucose-Capillary 149 (H) 65 - 99 mg/dL   No components found for: ESR, C REACTIVE PROTEIN MICRO: Recent Results (from the past 720 hour(s))  Blood Culture (routine x 2)     Status: None (Preliminary result)   Collection Time: 04/15/16 10:07 AM  Result Value Ref Range Status   Specimen Description BLOOD LEFT WRIST  Final   Special Requests BOTTLES DRAWN AEROBIC AND ANAEROBIC  5CC  Final   Culture NO GROWTH 4 DAYS  Final   Report Status PENDING  Incomplete  Blood Culture (routine x 2)     Status: Abnormal (Preliminary result)   Collection Time: 04/15/16 10:07 AM  Result Value Ref Range Status   Specimen Description BLOOD RIGHT WRIST  Final   Special Requests BOTTLES DRAWN AEROBIC AND ANAEROBIC  2CC  Final   Culture  Setup Time   Final    GRAM POSITIVE COCCI IN BOTH AEROBIC AND ANAEROBIC BOTTLES CRITICAL RESULT CALLED TO, READ BACK BY AND VERIFIED WITH: NATE COOKSON AT 7673 04/16/16.PMH CONFIRMED BY [place]    Culture (A)  Final    STAPHYLOCOCCUS SPECIES IN BOTH AEROBIC AND ANAEROBIC BOTTLES    Report Status PENDING  Incomplete   Organism ID, Bacteria STAPHYLOCOCCUS SPECIES  Final      Susceptibility   Staphylococcus species - MIC*    CIPROFLOXACIN Value in next row Intermediate      INTERMEDIATE2    ERYTHROMYCIN Value in  next row Resistant      RESISTANT>=8    GENTAMICIN Value in next row Sensitive      SENSITIVE<=0.5    OXACILLIN Value in next row Resistant      RESISTANT>=4    TETRACYCLINE Value in next row Sensitive      SENSITIVE2    VANCOMYCIN Value in next row Sensitive      SENSITIVE1    TRIMETH/SULFA Value in next row Sensitive      SENSITIVE<=10    CLINDAMYCIN Value in next row Sensitive      SENSITIVE<=0.25    RIFAMPIN Value in next row Sensitive      SENSITIVE<=0.5    Inducible Clindamycin Value in next row Sensitive      SENSITIVE<=0.5    * STAPHYLOCOCCUS SPECIES  Urine culture     Status: Abnormal   Collection Time: 04/15/16 10:07 AM  Result Value Ref Range Status   Specimen Description URINE, RANDOM  Final   Special Requests NONE  Final   Culture (A)  Final    <10,000 COLONIES/mL INSIGNIFICANT GROWTH Performed at [facility]    Report Status 04/16/2016 FINAL  Final  Blood Culture ID Panel (Reflexed)     Status: Abnormal   Collection Time: 04/15/16 10:07 AM  Result Value Ref Range Status   Enterococcus species NOT DETECTED NOT DETECTED Final   Vancomycin resistance NOT DETECTED NOT DETECTED Final   Listeria monocytogenes NOT DETECTED NOT DETECTED Final   Staphylococcus species DETECTED (A) NOT DETECTED Final    Comment: CRITICAL RESULT CALLED TO, READ BACK BY AND VERIFIED WITH: NATE COOKSON AT 3762 04/16/16.PMH    Staphylococcus aureus NOT DETECTED NOT DETECTED Final   Methicillin resistance DETECTED (A) NOT DETECTED Final    Comment: CRITICAL RESULT CALLED TO, READ BACK BY AND VERIFIED WITH: NATE COOKSON AT 8315 04/16/16.PMH    Streptococcus species NOT DETECTED NOT DETECTED Final   Streptococcus agalactiae NOT DETECTED NOT DETECTED Final   Streptococcus pneumoniae NOT DETECTED NOT DETECTED Final   Streptococcus pyogenes NOT DETECTED NOT DETECTED Final   Acinetobacter baumannii NOT DETECTED NOT DETECTED Final   Enterobacteriaceae species NOT DETECTED NOT DETECTED  Final   Enterobacter cloacae complex NOT DETECTED NOT DETECTED Final   Escherichia coli NOT DETECTED NOT DETECTED Final   Klebsiella oxytoca NOT DETECTED NOT DETECTED Final   Klebsiella pneumoniae NOT DETECTED NOT DETECTED Final   Proteus species NOT DETECTED NOT DETECTED Final   Serratia marcescens NOT DETECTED NOT DETECTED Final   Carbapenem resistance NOT DETECTED NOT DETECTED Final   Haemophilus influenzae NOT DETECTED NOT DETECTED Final   Neisseria meningitidis NOT DETECTED NOT DETECTED Final   Pseudomonas aeruginosa NOT DETECTED NOT DETECTED Final   Candida albicans NOT DETECTED NOT DETECTED Final   Candida glabrata NOT DETECTED NOT DETECTED Final   Candida krusei NOT DETECTED NOT DETECTED Final   Candida parapsilosis NOT DETECTED NOT DETECTED Final   Candida tropicalis NOT DETECTED NOT DETECTED Final  MRSA PCR Screening     Status: None   Collection Time: 04/15/16  2:25 PM  Result Value Ref Range Status   MRSA by PCR NEGATIVE NEGATIVE Final    Comment:        The GeneXpert MRSA Assay (FDA approved for NASAL specimens only), is one component of a comprehensive MRSA colonization surveillance program. It is not intended to diagnose MRSA infection nor to guide or monitor treatment for MRSA infections.   CULTURE, BLOOD (ROUTINE X 2) w Reflex to ID Panel     Status: None (Preliminary result)   Collection Time: 04/19/16  5:40 AM  Result Value Ref Range Status   Specimen Description BLOOD LEFT HAND  Final   Special Requests BOTTLES DRAWN AEROBIC AND ANAEROBIC 10ML  Final   Culture NO GROWTH < 12 HOURS  Final   Report Status PENDING  Incomplete  CULTURE, BLOOD (ROUTINE X 2) w Reflex to ID Panel     Status: None (Preliminary result)   Collection Time: 04/19/16  5:47 AM  Result Value Ref Range Status   Specimen Description BLOOD LEFT HAND  Final   Special Requests BOTTLES DRAWN AEROBIC AND ANAEROBIC 10ML  Final   Culture NO GROWTH < 12 HOURS  Final   Report Status PENDING   Incomplete    IMAGING: Dg Chest 1 View  04/17/2016  CLINICAL DATA:  75 year old male with history of sepsis. EXAM: CHEST 1 VIEW COMPARISON:  Chest x-ray a 04/15/2016. FINDINGS: Mild diffuse interstitial prominence throughout the lungs bilaterally. Diffuse peribronchial cuffing. No definite pleural effusions. No definite consolidative airspace disease. Heart size appears within normal limits. The patient is extremely rotated to the right on today's  exam, resulting in distortion of the mediastinal contours and reduced diagnostic sensitivity and specificity for mediastinal pathology. Surgical clips in the right hilar region. IMPRESSION: 1. Limited study secondary to extreme patient rotation to the right. With these limitations in mind, there does appear to be diffuse peribronchial cuffing and interstitial prominence, which suggests an acute bronchitis. Electronically Signed   By: Vinnie Langton M.D.   On: 04/17/2016 15:09   Dg Chest Port 1 View  04/15/2016  CLINICAL DATA:  Sepsis.  Diaphoresis.  Fever.  Tachycardia. EXAM: PORTABLE CHEST 1 VIEW COMPARISON:  09/01/2012. FINDINGS: Poor inspiration. Interval mild enlargement of the cardiac silhouette. Interval mild prominence of the interstitial markings. Progressive elevation of the right hemidiaphragm. No airspace consolidation seen. Diffuse osteopenia. Right hilar surgical clips are again demonstrated. IMPRESSION: 1. Interval mild cardiomegaly and mild interstitial lung disease with an appearance suggesting chronic interstitial lung disease. 2. No evidence of pneumonia on a single view. Electronically Signed   By: Claudie Revering M.D.   On: 04/15/2016 10:35   Impressions:  - There is severe LV dysfunction with left ventricular ejection  fraction 35% and dilated aortic root. Quality of the study was  suboptimal.  Assessment:   SHAQUIL ALDANA is a 75 y.o. male with cognitive impairment, largely bedbound admitted with AMS, fevers, leuko cytosis and  UA with TNTC wbc.  [place] with 1/2 sets with Staph epi.  FU bcx negative. Echo negative for vegs.   I suspect he has a UTI with TNTC wbc and that the [place] is contamint. Does not have a PPM or other prosthetic blood stream devices.   Recommendations Stop vanco Change back to ceftriaxone Change foley and resend UA and UCX Monitor wbc and fever curve.  If improving can dc on oral cefuroxime.  Thank you very much for allowing me to participate in the care of this patient. Please call with questions.   Cheral Marker. Ola Spurr, MD

## 2016-04-19 NOTE — Progress Notes (Signed)
Pharmacy Antibiotic Note  Nathan NeighboursRalph L Litle is a 75 y.o. male admitted on 04/15/2016 with bacteremia.  Pharmacy has been consulted for vancomycin dosing.  Plan: Current orders for vancomycin 1gm IV Q18H. Renal function improved. Will change to vancomycin 1250mg  Q18H to target trough of 15-5020mcg/ml   Trough 6/2 at 1830, not quite steady state of this regimen.    Height: 5\' 11"  (180.3 cm) Weight: 207 lb 9.6 oz (94.167 kg) IBW/kg (Calculated) : 75.3  Temp (24hrs), Avg:98.8 F (37.1 C), Min:97.6 F (36.4 C), Max:99.8 F (37.7 C)   Recent Labs Lab 04/15/16 1007 04/16/16 0228 04/17/16 0654 04/18/16 0346 04/19/16 0547  WBC 28.1* 18.4* 14.6* 15.2* 12.9*  CREATININE 1.95* 1.81*  --  1.64* 1.54*  LATICACIDVEN 1.5  --   --   --   --     Estimated Creatinine Clearance: 50.1 mL/min (by C-G formula based on Cr of 1.54).    No Known Allergies  Antimicrobials this admission: Vancomycin 5/28>> 5/28 Zosyn 5/27: one time dose Ceftriaxone 5/28>>5/30 Vancomycin 5/30>>  Dose adjustments this admission: 5/31 Vanc 1gm Q18H > 1250 Q18H Trough prior to dose on 6/2 at 1830  Microbiology results: 5/31 Blood x 2 pending 5/27 Blood x 2 - staph species, methicillin resistance detected x2 ID, sens pending 5/27 Urine < 100k 5/27 MRSA PCR negative  Thank you for allowing pharmacy to be a part of this patient's care.  Annikah Lovins C 04/19/2016 9:30 AM

## 2016-04-19 NOTE — Progress Notes (Signed)
Patient ID: Nathan Figueroa, male   DOB: 03/21/1941, 75 y.o.   MRN: [id number] [facility] PROGRESS NOTE  Nathan Figueroa YNW:[id number] DOB: 12/07/1940 DOA: 04/15/2016 PCP: Dorothey Baseman, MD  HPI/Subjective: Patient asking how long he has to stay here. No chest pain or shortness of breath  Objective: Filed Vitals:   04/19/16 1014 04/19/16 1452  BP:  101/78  Pulse:  79  Temp: 98.7 F (37.1 C) 97.8 F (36.6 C)  Resp:  20    Filed Weights   04/15/16 1022 04/15/16 1700  Weight: 101 kg (222 lb 10.6 oz) 94.167 kg (207 lb 9.6 oz)    ROS: Review of Systems  Constitutional: Negative for fever and chills.  Eyes: Negative for blurred vision.  Respiratory: Negative for cough and shortness of breath.   Cardiovascular: Negative for chest pain.  Gastrointestinal: Negative for nausea, vomiting, abdominal pain, diarrhea and constipation.  Genitourinary: Negative for dysuria.  Musculoskeletal: Positive for joint pain.  Neurological: Negative for dizziness and headaches.   Exam: Physical Exam  HENT:  Nose: No mucosal edema.  Mouth/Throat: No oropharyngeal exudate or posterior oropharyngeal edema.  Eyes: Conjunctivae and lids are normal. Pupils are equal, round, and reactive to light.  Had to open his left eyelid for him but that he was able to hold it up a little bit.  Neck: No JVD present. Carotid bruit is not present. No edema present. No thyroid mass and no thyromegaly present.  Cardiovascular: S1 normal and S2 normal.  Exam reveals no gallop.   No murmur heard. Pulses:      Dorsalis pedis pulses are 2+ on the right side, and 2+ on the left side.  Respiratory: No respiratory distress. He has no wheezes. He has rhonchi in the right lower field. He has no rales.  GI: Soft. Bowel sounds are normal. There is no tenderness.  Musculoskeletal:       Right ankle: He exhibits swelling.       Left ankle: He exhibits swelling.  Lymphadenopathy:    He has no cervical adenopathy.   Neurological: He is alert.  Skin: Skin is warm. No rash noted. Nails show no clubbing.  Psychiatric: He has a normal mood and affect.      Data Reviewed: Basic Metabolic Panel:  Recent Labs Lab 04/15/16 1007 04/16/16 0228 04/18/16 0346 04/19/16 0547  NA 145 145 142 144  K 3.5 3.6 4.1 3.7  CL 109 113* 110 112*  CO2 23 23 23 25   GLUCOSE 181* 132* 117* 118*  BUN 22* 19 22* 22*  CREATININE 1.95* 1.81* 1.64* 1.54*  CALCIUM 8.9 8.0* 8.1* 8.4*   Liver Function Tests:  Recent Labs Lab 04/15/16 1007  AST 25  ALT 16*  ALKPHOS 108  BILITOT 1.4*  PROT 8.8*  ALBUMIN 3.4*    Recent Labs Lab 04/15/16 1007  LIPASE 19   CBC:  Recent Labs Lab 04/15/16 1007 04/16/16 0228 04/17/16 0654 04/18/16 0346 04/19/16 0547  WBC 28.1* 18.4* 14.6* 15.2* 12.9*  NEUTROABS 23.0*  --   --   --   --   HGB 13.4 11.8* 11.6* 11.2* 11.4*  HCT 40.0 36.0* 35.0* 33.1* 34.3*  MCV 89.9 90.6 90.5 89.6 90.9  PLT 174 137* 138* 137* 146*   Cardiac Enzymes:  Recent Labs Lab 04/15/16 1007 04/15/16 1553 04/15/16 2207 04/16/16 0228  TROPONINI 0.14* 1.38* 4.16* 7.92*     Recent Results (from the past 240 hour(s))  Blood Culture (routine x 2)  Status: None (Preliminary result)   Collection Time: 04/15/16 10:07 AM  Result Value Ref Range Status   Specimen Description BLOOD LEFT WRIST  Final   Special Requests BOTTLES DRAWN AEROBIC AND ANAEROBIC  5CC  Final   Culture NO GROWTH 4 DAYS  Final   Report Status PENDING  Incomplete  Blood Culture (routine x 2)     Status: Abnormal (Preliminary result)   Collection Time: 04/15/16 10:07 AM  Result Value Ref Range Status   Specimen Description BLOOD RIGHT WRIST  Final   Special Requests BOTTLES DRAWN AEROBIC AND ANAEROBIC  2CC  Final   Culture  Setup Time   Final    GRAM POSITIVE COCCI IN BOTH AEROBIC AND ANAEROBIC BOTTLES CRITICAL RESULT CALLED TO, READ BACK BY AND VERIFIED WITH: NATE COOKSON AT [id number] 04/16/16.PM[facility] CONFIRMED BY [facility]     Culture (A)  Final    STAPHYLOCOCCUS SPECIES IN BOTH AEROBIC AND ANAEROBIC BOTTLES    Report Status PENDING  Incomplete   Organism ID, Bacteria STAPHYLOCOCCUS SPECIES  Final      Susceptibility   Staphylococcus species - MIC*    CIPROFLOXACIN Value in next row Intermediate      INTERMEDIATE2    ERYTHROMYCIN Value in next row Resistant      RESISTANT>=8    GENTAMICIN Value in next row Sensitive      SENSITIVE<=0.5    OXACILLIN Value in next row Resistant      RESISTANT>=4    TETRACYCLINE Value in next row Sensitive      SENSITIVE2    VANCOMYCIN Value in next row Sensitive      SENSITIVE1    TRIMETH/SULFA Value in next row Sensitive      SENSITIVE<=10    CLINDAMYCIN Value in next row Sensitive      SENSITIVE<=0.25    RIFAMPIN Value in next row Sensitive      SENSITIVE<=0.5    Inducible Clindamycin Value in next row Sensitive      SENSITIVE<=0.5    * STAPHYLOCOCCUS SPECIES  Urine culture     Status: Abnormal   Collection Time: 04/15/16 10:07 AM  Result Value Ref Range Status   Specimen Description URINE, RANDOM  Final   Special Requests NONE  Final   Culture (A)  Final    <10,000 COLONIES/mL INSIGNIFICANT GROWTH [facility]    Report Status 04/16/2016 FINAL  Final  Blood Culture ID Panel (Reflexed)     Status: Abnormal   Collection Time: 04/15/16 10:07 AM  Result Value Ref Range Status   Enterococcus species NOT DETECTED NOT DETECTED Final   Vancomycin resistance NOT DETECTED NOT DETECTED Final   Listeria monocytogenes NOT DETECTED NOT DETECTED Final   Staphylococcus species DETECTED (A) NOT DETECTED Final    Comment: CRITICAL RESULT CALLED TO, READ BACK BY AND VERIFIED WITH: NATE COOKSON AT [id number] 04/16/16.PMH    Staphylococcus aureus NOT DETECTED NOT DETECTED Final   Methicillin resistance DETECTED (A) NOT DETECTED Final    Comment: CRITICAL RESULT CALLED TO, READ BACK BY AND VERIFIED WITH: NATE COOKSON AT [id number] 04/16/16.PMH    Streptococcus  species NOT DETECTED NOT DETECTED Final   Streptococcus agalactiae NOT DETECTED NOT DETECTED Final   Streptococcus pneumoniae NOT DETECTED NOT DETECTED Final   Streptococcus pyogenes NOT DETECTED NOT DETECTED Final   Acinetobacter baumannii NOT DETECTED NOT DETECTED Final   Enterobacteriaceae species NOT DETECTED NOT DETECTED Final   Enterobacter cloacae complex NOT DETECTED NOT DETECTED Final   Escherichia coli  NOT DETECTED NOT DETECTED Final   Klebsiella oxytoca NOT DETECTED NOT DETECTED Final   Klebsiella pneumoniae NOT DETECTED NOT DETECTED Final   Proteus species NOT DETECTED NOT DETECTED Final   Serratia marcescens NOT DETECTED NOT DETECTED Final   Carbapenem resistance NOT DETECTED NOT DETECTED Final   Haemophilus influenzae NOT DETECTED NOT DETECTED Final   Neisseria meningitidis NOT DETECTED NOT DETECTED Final   Pseudomonas aeruginosa NOT DETECTED NOT DETECTED Final   Candida albicans NOT DETECTED NOT DETECTED Final   Candida glabrata NOT DETECTED NOT DETECTED Final   Candida krusei NOT DETECTED NOT DETECTED Final   Candida parapsilosis NOT DETECTED NOT DETECTED Final   Candida tropicalis NOT DETECTED NOT DETECTED Final  MRSA PCR Screening     Status: None   Collection Time: 04/15/16  2:25 PM  Result Value Ref Range Status   MRSA by PCR NEGATIVE NEGATIVE Final    Comment:        The GeneXpert MRSA Assay (FDA approved for NASAL specimens only), is one component of a comprehensive MRSA colonization surveillance program. It is not intended to diagnose MRSA infection nor to guide or monitor treatment for MRSA infections.   CULTURE, BLOOD (ROUTINE X 2) w Reflex to ID Panel     Status: None (Preliminary result)   Collection Time: 04/19/16  5:40 AM  Result Value Ref Range Status   Specimen Description BLOOD LEFT HAND  Final   Special Requests BOTTLES DRAWN AEROBIC AND ANAEROBIC  Final   Culture NO GROWTH < 12 HOURS  Final   Report Status PENDING  Incomplete  CULTURE,  BLOOD (ROUTINE X 2) w Reflex to ID Panel     Status: None (Preliminary result)   Collection Time: 04/19/16  5:47 AM  Result Value Ref Range Status   Specimen Description BLOOD LEFT HAND  Final   Special Requests BOTTLES DRAWN AEROBIC AND ANAEROBIC  Final   Culture NO GROWTH < 12 HOURS  Final   Report Status PENDING  Incomplete      Scheduled Meds: . antiseptic oral rinse  7 mL Mouth Rinse BID  . aspirin EC  81 mg Oral Daily  . carvedilol  12.5 mg Oral BID WC  . cefTRIAXone (ROCEPHIN)  IV  1 g Intravenous Q24H  . diltiazem  120 mg Oral Daily  . enoxaparin (LOVENOX) injection  40 mg Subcutaneous Q24H  . gabapentin  200 mg Oral BID  . multivitamin with minerals  1 tablet Oral Daily  . nystatin  5 mL Oral QID  . sodium chloride flush  3 mL Intravenous Q12H    Assessment/Plan:  1. Clinical sepsis with acute encephalopathy.  Blood culture positive for staph species. Appreciate infectious disease consultation and he stop the vancomycin and put back on the Rocephin. Unclear if this patient came with Foley catheter or not. Tried to reach sister today about this question. Temperature curve trending better and white blood cell count trending better. 2. Non-STEMI. Continue aspirin and Coreg. Seen in consultation by cardiology. Likely medical management. With history of rhabdomyolysis I am hesitant on statin at this point. 3. Acute kidney injury.  Creatinine improved down to 1.5 for today 4. Essential hypertension and tachycardia. On Cardizem CD and continue Coreg. 5. Weakness. Will end up going back to rehabilitation in next day or so. 6. Anemia. Continue to monitor 7. Thrush- nystatin swish and swallow  Code Status:     Code Status Orders        Start  Ordered   04/15/16 1402  Full code   Continuous     04/15/16 1401    Code Status History    Date Active Date Inactive Code Status Order ID Comments User Context   This patient has a current code status but no historical code  status.    Advance Directive Documentation        Most Recent Value   Type of Advance Directive  Out of facility DNR (pink MOST or yellow form)   Pre-existing out of facility DNR order (yellow form or pink MOST form)  Pink MOST form placed in chart (order not valid for inpatient use)   "MOST" Form in Place?       Disposition Plan: Back to [facility] Next day or so. Able to reach sister yesterday (phone kept ringing today)  Consultants: Cardiology Infectious disease  Antibiotics:  Rocephin  Time spent: 25 minutes  Alford Highland  [employer]

## 2016-04-19 NOTE — Progress Notes (Signed)
  SUBJECTIVE: The patient was sleeping soundly this morning and upon brief arousal denies any complaints   Filed Vitals:   04/18/16 0510 04/18/16 1314 04/18/16 2042 04/19/16 0457  BP: 109/72 104/79 105/70 122/77  Pulse: 92 84 104 116  Temp: 98.8 F (37.1 C) 97.6 F (36.4 C) 98.9 F (37.2 C) 99.8 F (37.7 C)  TempSrc: Oral Oral Oral Oral  Resp: 24 22 16 18   Height:      Weight:      SpO2: 95% 97% 90% 94%    Intake/Output Summary (Last 24 hours) at 04/19/16 0807 Last data filed at 04/19/16 0530  Gross per 24 hour  Intake      0 ml  Output    600 ml  Net   -600 ml    LABS: Basic Metabolic Panel:  Recent Labs  16/08/9604/30/17 0346 04/19/16 0547  NA 142 144  K 4.1 3.7  CL 110 112*  CO2 23 25  GLUCOSE 117* 118*  BUN 22* 22*  CREATININE 1.64* 1.54*  CALCIUM 8.1* 8.4*   Liver Function Tests: No results for input(s): AST, ALT, ALKPHOS, BILITOT, PROT, ALBUMIN in the last 72 hours. No results for input(s): LIPASE, AMYLASE in the last 72 hours. CBC:  Recent Labs  04/18/16 0346 04/19/16 0547  WBC 15.2* 12.9*  HGB 11.2* 11.4*  HCT 33.1* 34.3*  MCV 89.6 90.9  PLT 137* 146*   Cardiac Enzymes: No results for input(s): CKTOTAL, CKMB, CKMBINDEX, TROPONINI in the last 72 hours. BNP: Invalid input(s): POCBNP D-Dimer: No results for input(s): DDIMER in the last 72 hours. Hemoglobin A1C: No results for input(s): HGBA1C in the last 72 hours. Fasting Lipid Panel: No results for input(s): CHOL, HDL, LDLCALC, TRIG, CHOLHDL, LDLDIRECT in the last 72 hours. Thyroid Function Tests: No results for input(s): TSH, T4TOTAL, T3FREE, THYROIDAB in the last 72 hours.  Invalid input(s): FREET3 Anemia Panel: No results for input(s): VITAMINB12, FOLATE, FERRITIN, TIBC, IRON, RETICCTPCT in the last 72 hours.   PHYSICAL EXAM General: Well developed, well nourished, in no acute distress HEENT:  Normocephalic and atramatic Neck:  No JVD.  Lungs: Clear bilaterally to auscultation and  percussion. Heart: HRRR . Normal S1 and S2 without gallops or murmurs.  Abdomen: Bowel sounds are positive, abdomen soft and non-tender  Msk:  Back normal, normal gait. Normal strength and tone for age. Extremities: No clubbing, cyanosis or edema.   Neuro: Alert and oriented X 3. Psych:  Good affect, responds appropriately    ASSESSMENT AND PLAN: Mildly elevated troponin levels in setting of sepsis and encephalopathy. Patient has a history of rhabdomyolysis and do not recommend statin. The patient is not currently a candidate for cardiac catheterization. Treatment medically.  Active Problems:   Sepsis (HCC)    Berton BonJanine Kahla Risdon, NP 04/19/2016 8:07 AM

## 2016-04-19 NOTE — Care Management Important Message (Signed)
Important Message  Patient Details  Name: Rockie NeighboursRalph L Gores MRN: [id number] Date of Birth: 05/07/1941   Medicare Important Message Given:  Yes    Gwenette GreetBrenda S Lyndsy Gilberto, RN 04/19/2016, 11:42 AM

## 2016-04-19 NOTE — Progress Notes (Signed)
PHARMACY - PHYSICIAN COMMUNICATION CRITICAL VALUE ALERT - BLOOD CULTURE IDENTIFICATION (BCID)  Results for orders placed or performed during the hospital encounter of 04/15/16  Blood Culture ID Panel (Reflexed) (Collected: 04/15/2016 10:07 AM)  Result Value Ref Range   Enterococcus species NOT DETECTED NOT DETECTED   Vancomycin resistance NOT DETECTED NOT DETECTED   Listeria monocytogenes NOT DETECTED NOT DETECTED   Staphylococcus species DETECTED (A) NOT DETECTED   Staphylococcus aureus NOT DETECTED NOT DETECTED   Methicillin resistance DETECTED (A) NOT DETECTED   Streptococcus species NOT DETECTED NOT DETECTED   Streptococcus agalactiae NOT DETECTED NOT DETECTED   Streptococcus pneumoniae NOT DETECTED NOT DETECTED   Streptococcus pyogenes NOT DETECTED NOT DETECTED   Acinetobacter baumannii NOT DETECTED NOT DETECTED   Enterobacteriaceae species NOT DETECTED NOT DETECTED   Enterobacter cloacae complex NOT DETECTED NOT DETECTED   Escherichia coli NOT DETECTED NOT DETECTED   Klebsiella oxytoca NOT DETECTED NOT DETECTED   Klebsiella pneumoniae NOT DETECTED NOT DETECTED   Proteus species NOT DETECTED NOT DETECTED   Serratia marcescens NOT DETECTED NOT DETECTED   Carbapenem resistance NOT DETECTED NOT DETECTED   Haemophilus influenzae NOT DETECTED NOT DETECTED   Neisseria meningitidis NOT DETECTED NOT DETECTED   Pseudomonas aeruginosa NOT DETECTED NOT DETECTED   Candida albicans NOT DETECTED NOT DETECTED   Candida glabrata NOT DETECTED NOT DETECTED   Candida krusei NOT DETECTED NOT DETECTED   Candida parapsilosis NOT DETECTED NOT DETECTED   Candida tropicalis NOT DETECTED NOT DETECTED   New BCID returned Proteus no carbapenemase per lab.   Name of physician (or Provider) Contacted: Willis  Changes to prescribed antibiotics required: Changed Rocephin to 2 grams q 24 hours.    Lavontay Kirk S 04/19/2016  10:58 PM

## 2016-04-20 LAB — BLOOD CULTURE ID PANEL (REFLEXED)
Acinetobacter baumannii: NOT DETECTED
CANDIDA ALBICANS: NOT DETECTED
CANDIDA GLABRATA: NOT DETECTED
CANDIDA KRUSEI: NOT DETECTED
CANDIDA TROPICALIS: NOT DETECTED
Candida parapsilosis: NOT DETECTED
Carbapenem resistance: NOT DETECTED
ENTEROBACTER CLOACAE COMPLEX: NOT DETECTED
ESCHERICHIA COLI: NOT DETECTED
Enterobacteriaceae species: DETECTED — AB
Enterococcus species: NOT DETECTED
HAEMOPHILUS INFLUENZAE: NOT DETECTED
KLEBSIELLA OXYTOCA: NOT DETECTED
Klebsiella pneumoniae: NOT DETECTED
Listeria monocytogenes: NOT DETECTED
METHICILLIN RESISTANCE: NOT DETECTED
Neisseria meningitidis: NOT DETECTED
PROTEUS SPECIES: DETECTED — AB
Pseudomonas aeruginosa: NOT DETECTED
SERRATIA MARCESCENS: NOT DETECTED
STREPTOCOCCUS PNEUMONIAE: NOT DETECTED
STREPTOCOCCUS PYOGENES: NOT DETECTED
STREPTOCOCCUS SPECIES: NOT DETECTED
Staphylococcus aureus (BCID): NOT DETECTED
Staphylococcus species: NOT DETECTED
Streptococcus agalactiae: NOT DETECTED
Vancomycin resistance: NOT DETECTED

## 2016-04-20 LAB — CBC
HEMATOCRIT: 33.7 % — AB (ref 40.0–52.0)
HEMOGLOBIN: 11.2 g/dL — AB (ref 13.0–18.0)
MCH: 29.7 pg (ref 26.0–34.0)
MCHC: 33.2 g/dL (ref 32.0–36.0)
MCV: 89.4 fL (ref 80.0–100.0)
Platelets: 154 10*3/uL (ref 150–440)
RBC: 3.77 MIL/uL — ABNORMAL LOW (ref 4.40–5.90)
RDW: 14.1 % (ref 11.5–14.5)
WBC: 12.8 10*3/uL — AB (ref 3.8–10.6)

## 2016-04-20 LAB — BASIC METABOLIC PANEL
ANION GAP: 7 (ref 5–15)
BUN: 22 mg/dL — AB (ref 6–20)
CHLORIDE: 111 mmol/L (ref 101–111)
CO2: 24 mmol/L (ref 22–32)
Calcium: 8.3 mg/dL — ABNORMAL LOW (ref 8.9–10.3)
Creatinine, Ser: 1.39 mg/dL — ABNORMAL HIGH (ref 0.61–1.24)
GFR, EST AFRICAN AMERICAN: 56 mL/min — AB (ref >60)
GFR, EST NON AFRICAN AMERICAN: 49 mL/min — AB (ref >60)
GLUCOSE: 109 mg/dL — AB (ref 65–99)
Potassium: 3.9 mmol/L (ref 3.5–5.1)
SODIUM: 142 mmol/L (ref 135–145)

## 2016-04-20 LAB — CULTURE, BLOOD (ROUTINE X 2)

## 2016-04-20 MED ORDER — NYSTATIN 100000 UNIT/ML MT SUSP: 5.0000 mL | Freq: Four times a day (QID) | OROMUCOSAL | Status: AC

## 2016-04-20 MED ORDER — DILTIAZEM HCL ER COATED BEADS 120 MG PO CP24: 120.0000 mg | ORAL_CAPSULE | Freq: Every day | ORAL | Status: AC

## 2016-04-20 MED ORDER — CEFUROXIME AXETIL 500 MG PO TABS: 500.0000 mg | ORAL_TABLET | Freq: Two times a day (BID) | ORAL | Status: AC

## 2016-04-20 MED ORDER — CIPROFLOXACIN HCL 250 MG PO TABS
250.0000 mg | ORAL_TABLET | Freq: Two times a day (BID) | ORAL | Status: DC
  Administered 2016-04-20: 250 mg via ORAL
  Filled 2016-04-20 (×2): qty 1

## 2016-04-20 MED ORDER — LISINOPRIL 5 MG PO TABS
5.0000 mg | ORAL_TABLET | Freq: Every day | ORAL | Status: DC
  Administered 2016-04-20: 5 mg via ORAL
  Filled 2016-04-20: qty 1

## 2016-04-20 MED ORDER — BOOST / RESOURCE BREEZE PO LIQD
1.0000 | Freq: Two times a day (BID) | ORAL | Status: DC
  Administered 2016-04-20 (×2): 1 via ORAL

## 2016-04-20 MED ORDER — LISINOPRIL 5 MG PO TABS: 5.0000 mg | ORAL_TABLET | Freq: Every day | ORAL | Status: AC

## 2016-04-20 MED ORDER — CARVEDILOL 12.5 MG PO TABS: 12.5000 mg | ORAL_TABLET | Freq: Two times a day (BID) | ORAL | Status: AC

## 2016-04-20 MED ORDER — BOOST / RESOURCE BREEZE PO LIQD: 1.0000 | Freq: Two times a day (BID) | ORAL | Status: AC

## 2016-04-20 MED ORDER — ASPIRIN 81 MG PO TBEC: 81.0000 mg | DELAYED_RELEASE_TABLET | Freq: Every day | ORAL | Status: AC

## 2016-04-20 MED ORDER — CIPROFLOXACIN HCL 250 MG PO TABS: 250.0000 mg | ORAL_TABLET | Freq: Two times a day (BID) | ORAL | Status: AC

## 2016-04-20 NOTE — Discharge Summary (Addendum)
[facility] - [place] at [facility]   PATIENT NAME: Nathan Figueroa    MR#:  [id number]  DATE OF BIRTH:  11/12/1941  DATE OF ADMISSION:  04/15/2016 ADMITTING PHYSICIAN: Adrian SaranSital Donna Silverman, MD  DATE OF DISCHARGE: 04/20/2016 PRIMARY CARE PHYSICIAN: Dorothey BasemanAVID BRONSTEIN, MD    ADMISSION DIAGNOSIS:  UTI (lower urinary tract infection) [N39.0] Sepsis, due to unspecified organism (HCC) [A41.9] Altered mental status, unspecified altered mental status type [R41.82]  DISCHARGE DIAGNOSIS:  Active Problems:   Sepsis (HCC)   SECONDARY DIAGNOSIS:   Past Medical History  Diagnosis Date  . Hypertension   . History of rhabdomyolysis   . Weakness   . Vitamin disease   . Muscle weakness (generalized)   . Special sensory attacks (HCC)   . Urinary tract infection, site not specified   . Anemia, unspecified   . Thiamin deficiency   . Acute bronchitis   . Epidemic vomiting syndrome   . Change of dressing   . Paroxysmal tachycardia, unspecified [facility](HCC)     HOSPITAL COURSE:   75 year old male with cognitive impairment and cognitive communication deficit who presents with altered mental status. For further details please further H&P.   1. Sepsis with acute encephalopathy: Blood cultures positive for staph species however after ID consultation this was thought to be contaminant. On the day of discharge it is noted that patient has 1 out of 2blood cultures which were positive for Proteus species. Repeat blood cultures were negative. He will be discharged with Ciprofloxacillin and have Dr Sampson GoonFitzgerald will follow up on final blood cultures. Sepsis is thought to be due to urinary tract infection. He was initially placed on broad-spectrum antibiotics and then changed to ceftriaxone. White blood count is stable at 12 and patient has been afebrile. He will be discharged on Ceftin to complete course for urinary tract infection and sepsis.  2. Non-ST elevation MI: Troponin went up to 7. Patient  was evaluated by cardiology. Due to comorbidities and bedbound status it was decided that patient would benefit from medical management rather than invasive management. Continue aspirin, Coreg and lisinopril. Due to history of rhabdomyolysis statin was not started.  3. Acute kidney injury due to sepsis: Creatinine has improved.  4. Essential hypertension: Patient will continue Cardia's exam and Coreg.  5. Thrush: Patient is on nystatin swish and swallow which should be used for 3 more days.  DISCHARGE CONDITIONS AND DIET:   Patient is stable for discharge to skilled nursing facility  Dysphagia 2 diet CONSULTS OBTAINED:  Treatment Team:  Laurier NancyShaukat A Khan, MD Mick Sellavid P Fitzgerald, MD  DRUG ALLERGIES:  No Known Allergies  DISCHARGE MEDICATIONS:   Current Discharge Medication List    START taking these medications   Details  aspirin EC 81 MG EC tablet Take 1 tablet (81 mg total) by mouth daily. Qty: 120 tablet, Refills: 0    carvedilol (COREG) 12.5 MG tablet Take 1 tablet (12.5 mg total) by mouth 2 (two) times daily with a meal. Qty: 60 tablet, Refills: 0    cefUROXime (CEFTIN) 500 MG tablet Take 1 tablet (500 mg total) by mouth 2 (two) times daily with a meal. Qty: 12 tablet, Refills: 0    ciprofloxacin (CIPRO) 250 MG tablet Take 1 tablet (250 mg total) by mouth 2 (two) times daily. Qty: 28 tablet, Refills: 0    diltiazem (CARDIZEM CD) 120 MG 24 hr capsule Take 1 capsule (120 mg total) by mouth daily. Qty: 60 capsule, Refills: 0    feeding supplement (BOOST /  RESOURCE BREEZE) LIQD Take 1 Container by mouth 2 (two) times daily between meals. Qty: 1 Container, Refills: 0    lisinopril (PRINIVIL,ZESTRIL) 5 MG tablet Take 1 tablet (5 mg total) by mouth daily. Qty: 30 tablet, Refills: 0    nystatin (MYCOSTATIN) 100000 UNIT/ML suspension Take 5 mLs (500,000 Units total) by mouth 4 (four) times daily. Qty: 60 mL, Refills: 0      CONTINUE these medications which have NOT  CHANGED   Details  acetaminophen (TYLENOL) 650 MG CR tablet Take 650 mg by mouth 2 (two) times daily.    gabapentin (NEURONTIN) 100 MG capsule Take 200 mg by mouth 2 (two) times daily.    Multiple Vitamin (MULTIVITAMIN) tablet Take 1 tablet by mouth daily.    traMADol (ULTRAM) 50 MG tablet Take 50 mg by mouth 3 (three) times daily as needed for moderate pain or severe pain.      STOP taking these medications     amLODipine (NORVASC) 10 MG tablet               Today   CHIEF COMPLAINT:   No acute events overnight. Patient not verbalizing much today.   VITAL SIGNS:  Blood pressure 120/79, pulse 100, temperature 98.5 F (36.9 C), temperature source Oral, resp. rate 20, height 5\' 11"  (1.803 m), weight 93.441 kg (206 lb), SpO2 96 %.   REVIEW OF SYSTEMS:  Review of Systems  Unable to perform ROS: medical condition     PHYSICAL EXAMINATION:  GENERAL:  75 y.o.-year-old patient lying in the bed with no acute distress. Opens his eyes to say good morning NECK:  Supple, no jugular venous distention. No thyroid enlargement, no tenderness.  LUNGS: Normal breath sounds bilaterally, no wheezing, rales, mild bilateral rhonchi  No use of accessory muscles of respiration.  CARDIOVASCULAR: S1, S2 normal. No murmurs, rubs, or gallops.  ABDOMEN: Soft, non-tender, non-distended. Bowel sounds present. No organomegaly or mass.  EXTREMITIES: + b/l pedal edema, no cyanosis, or clubbing.  PSYCHIATRIC: The patient is mostly sleeping at does open his eyes and respond yes or no  SKIN: No obvious rash, lesion, or ulcer.   DATA REVIEW:   CBC  Recent Labs Lab 04/20/16 0551  WBC 12.8*  HGB 11.2*  HCT 33.7*  PLT 154    Chemistries   Recent Labs Lab 04/15/16 1007  04/20/16 0551  NA 145  < > 142  K 3.5  < > 3.9  CL 109  < > 111  CO2 23  < > 24  GLUCOSE 181*  < > 109*  BUN 22*  < > 22*  CREATININE 1.95*  < > 1.39*  CALCIUM 8.9  < > 8.3*  AST 25  --   --   ALT 16*  --   --    ALKPHOS 108  --   --   BILITOT 1.4*  --   --   < > = values in this interval not displayed.  Cardiac Enzymes  Recent Labs Lab 04/15/16 1553 04/15/16 2207 04/16/16 0228  TROPONINI 1.38* 4.16* 7.92*    Microbiology Results  @[email or web address] @  RADIOLOGY:  No results found.     Stable for discharge SNF  Patient should follow up with PCP in 1 week  CODE STATUS:     Code Status Orders        Start     Ordered   04/15/16 1402  Full code   Continuous     04/15/16 1401  Code Status History    Date Active Date Inactive Code Status Order ID Comments User Context   This patient has a current code status but no historical code status.    Advance Directive Documentation        Most Recent Value   Type of Advance Directive  Out of facility DNR (pink MOST or yellow form)   Pre-existing out of facility DNR order (yellow form or pink MOST form)  Pink MOST form placed in chart (order not valid for inpatient use)   "MOST" Form in Place?        TOTAL TIME TAKING CARE OF THIS PATIENT: 36 minutes.    Note: This dictation was prepared with Dragon dictation along with smaller phrase technology. Any transcriptional errors that result from this process are unintentional.  Maryanne Huneycutt M.D on 04/20/2016 at 12:50 PM  Between 7am to 6pm - Pager - [phone] After 6pm go to [email or web address] - Social research officer, government  [facility]  Office  [phone]  CC: Primary care physician; Dorothey Baseman, MD Clydie Braun

## 2016-04-20 NOTE — Evaluation (Addendum)
Clinical/Bedside Swallow Evaluation Patient Details  Name: Nathan Figueroa MRN: [id number] Date of Birth: 11/16/1941  Today's Date: 04/20/2016 Time: SLP Start Time (ACUTE ONLY): 0830 SLP Stop Time (ACUTE ONLY): 0910 SLP Time Calculation (min) (ACUTE ONLY): 40 min  Past Medical History:  Past Medical History  Diagnosis Date  . Hypertension   . History of rhabdomyolysis   . Weakness   . Vitamin disease   . Muscle weakness (generalized)   . Special sensory attacks (HCC)   . Urinary tract infection, site not specified   . Anemia, unspecified   . Thiamin deficiency   . Acute bronchitis   . Epidemic vomiting syndrome   . Change of dressing   . Paroxysmal tachycardia, unspecified (HCC)    Past Surgical History: History reviewed. No pertinent past surgical history. HPI:  Pt is a 75 y.o. male with a known history of multiple medical issues including heavy alcohol abuse w/ withdrawal seizures, Cognitive impairment with cognitive communication deficits who is predominantly bedbound who presents from nursing facility where he resides due to altered mental status. Code sepsis was called in the emergency room. Patient received broad-spectrum antibiotics for fever and diaphoresis. Initially he was noted to have oxygen saturation of 88% on room air. He had a fever of 102 upon arrival. Pt is awake, speaks minimally, he follows basic commands. He communicated at this meal that he did not want most of the food items on the tray but agreed to OJ and Magic Cup nutritional supplement.    Assessment / Plan / Recommendation Clinical Impression   See Below under Diet Recommendation    Aspiration Risk   reduced following aspiration precautions and monitoring w/ meals   Diet Recommendation  Pt appeared to adequately tolerate trials of thin liquids and purees w/ 1 trial of soft solid (but refused further trials of soft solids) w/ no overt s/s of aspiration noted; no decline in respiratory status - vocal  quality was clear b/t trials when assessed. Oral phase was grossly wfl w/ the bolus trials taken; oral clearing noted post trials, no residue remained. Pt assisted in feeding self but required assistance d/t LUE weakness and closed hand; required positioning upright in bed for safe oral intake. Pt remains at reduced risk for aspiration following general aspiration precautions - including positioning/sitting fully upright for po intake. Pt instructed on aspiration precautions including small, single sips; eating and drinking slowly. Pt gave verbal acknowledgement. Recommend a Dys. 2 diet consistency(d/t missing many dentition) w/ thin liquids; aspiration precautions; meds in Puree - whole as tolerates. Tray setup and positioning upright at all meals. Recommend monitoring for any changes in pulmonary status while at SNF and f/u w/ MD as necessary.   Medication Administration: Whole meds with puree Compensations: Minimize environmental distractions;Slow rate;Small sips/bites;Lingual sweep for clearance of pocketing    Other  Recommendations Oral Care Recommendations: Oral care BID;Staff/trained caregiver to provide oral care   Follow up Recommendations  Skilled Nursing facility (TBD)    Frequency and Duration            Prognosis        Swallow Study   General HPI: Pt is a 75 y.o. male with a known history of multiple medical issues including heavy alcohol abuse w/ withdrawal seizures, Cognitive impairment with cognitive communication deficits who is predominantly bedbound who presents from nursing facility where he resides due to altered mental status. Code sepsis was called in the emergency room. Patient received broad-spectrum antibiotics for fever and  diaphoresis. Initially he was noted to have oxygen saturation of 88% on room air. He had a fever of 102 upon arrival. Pt is awake, speaks minimally, he follows basic commands. He communicated at this meal that he did not want most of the food items  on the tray but agreed to OJ and Magic Cup nutritional supplement.  Temperature Spikes Noted: No Oral Cavity - Dentition: Missing dentition    Oral/Motor/Sensory Function     Ice Chips     Thin Liquid      Nectar Thick     Honey Thick     Puree     Solid   GO           Jerilynn Som, MS, CCC-SLP  Watson,Katherine 04/20/2016,2:21 PM

## 2016-04-20 NOTE — Clinical Social Work Note (Signed)
Pt is ready for discharge today and will return to [facility]. CSW spoke with pt's sister, who is agreeable to discharge plan. Facility has received discharge information and is ready to admit pt. RN will call report and EMS will provide transportation. CSW is signing off as no further needs identified.   Dede QuerySarah Delisha Peaden, MSW, LCSW  Clinical Social Worker  [phone]

## 2016-04-20 NOTE — Progress Notes (Signed)
LCSW facilitating discharge to [facility]. IV removed. Foley removed. [facility]. No unanswered questions. EMS called for transport. Transported via EMS. Belongings and paperwork sent with patient and EMS.

## 2016-04-21 LAB — CULTURE, BLOOD (ROUTINE X 2)

## 2016-04-21 NOTE — Progress Notes (Signed)
Pharmacy Note - Antibiotic Follow-Up  Patient discharged yesterday on ceftin and cipro, had positive blood cultures for proteus species.  Final culture results proteus mirabilis sensitive to ampicilin, cefazolin, resistant to cipro (see micro report).  Discussed with Dr. Juliene PinaMody, verbal orders to discontinue cipro, continue ceftin. [facility] who will discontinue cipro at this time  Garlon HatchetJody Anyi Fels, PharmD Clinical Pharmacist  04/21/2016 2:48 PM

## 2016-04-24 LAB — CULTURE, BLOOD (ROUTINE X 2)
CULTURE: NO GROWTH
CULTURE: NO GROWTH

## 2016-05-07 ENCOUNTER — Other Ambulatory Visit: Payer: Self-pay

## 2016-05-07 ENCOUNTER — Inpatient Hospital Stay: Payer: Medicare Other

## 2016-05-07 ENCOUNTER — Encounter: Payer: Self-pay | Admitting: Emergency Medicine

## 2016-05-07 ENCOUNTER — Emergency Department: Payer: Medicare Other | Admitting: Certified Registered Nurse Anesthetist

## 2016-05-07 ENCOUNTER — Inpatient Hospital Stay
Admission: EM | Admit: 2016-05-07 | Discharge: 2016-05-20 | DRG: 870 | Disposition: E | Payer: Medicare Other | Attending: Internal Medicine | Admitting: Internal Medicine

## 2016-05-07 ENCOUNTER — Emergency Department: Payer: Medicare Other

## 2016-05-07 ENCOUNTER — Encounter: Admission: EM | Disposition: E | Payer: Self-pay | Source: Home / Self Care | Attending: Pulmonary Disease

## 2016-05-07 DIAGNOSIS — Z9079 Acquired absence of other genital organ(s): Secondary | ICD-10-CM

## 2016-05-07 DIAGNOSIS — Z9911 Dependence on respirator [ventilator] status: Secondary | ICD-10-CM | POA: Diagnosis not present

## 2016-05-07 DIAGNOSIS — I214 Non-ST elevation (NSTEMI) myocardial infarction: Secondary | ICD-10-CM | POA: Diagnosis present

## 2016-05-07 DIAGNOSIS — N201 Calculus of ureter: Secondary | ICD-10-CM

## 2016-05-07 DIAGNOSIS — Z87440 Personal history of urinary (tract) infections: Secondary | ICD-10-CM | POA: Diagnosis not present

## 2016-05-07 DIAGNOSIS — R6520 Severe sepsis without septic shock: Secondary | ICD-10-CM

## 2016-05-07 DIAGNOSIS — R34 Anuria and oliguria: Secondary | ICD-10-CM | POA: Diagnosis not present

## 2016-05-07 DIAGNOSIS — G9341 Metabolic encephalopathy: Secondary | ICD-10-CM | POA: Diagnosis present

## 2016-05-07 DIAGNOSIS — R140 Abdominal distension (gaseous): Secondary | ICD-10-CM | POA: Diagnosis present

## 2016-05-07 DIAGNOSIS — N390 Urinary tract infection, site not specified: Secondary | ICD-10-CM | POA: Diagnosis present

## 2016-05-07 DIAGNOSIS — R739 Hyperglycemia, unspecified: Secondary | ICD-10-CM | POA: Diagnosis present

## 2016-05-07 DIAGNOSIS — D638 Anemia in other chronic diseases classified elsewhere: Secondary | ICD-10-CM | POA: Diagnosis present

## 2016-05-07 DIAGNOSIS — A419 Sepsis, unspecified organism: Secondary | ICD-10-CM | POA: Diagnosis present

## 2016-05-07 DIAGNOSIS — R7989 Other specified abnormal findings of blood chemistry: Secondary | ICD-10-CM | POA: Insufficient documentation

## 2016-05-07 DIAGNOSIS — F0390 Unspecified dementia without behavioral disturbance: Secondary | ICD-10-CM | POA: Diagnosis present

## 2016-05-07 DIAGNOSIS — N179 Acute kidney failure, unspecified: Secondary | ICD-10-CM | POA: Diagnosis present

## 2016-05-07 DIAGNOSIS — Z515 Encounter for palliative care: Secondary | ICD-10-CM | POA: Diagnosis not present

## 2016-05-07 DIAGNOSIS — E876 Hypokalemia: Secondary | ICD-10-CM | POA: Diagnosis not present

## 2016-05-07 DIAGNOSIS — N132 Hydronephrosis with renal and ureteral calculous obstruction: Secondary | ICD-10-CM | POA: Diagnosis present

## 2016-05-07 DIAGNOSIS — M7989 Other specified soft tissue disorders: Secondary | ICD-10-CM

## 2016-05-07 DIAGNOSIS — J9601 Acute respiratory failure with hypoxia: Secondary | ICD-10-CM | POA: Diagnosis present

## 2016-05-07 DIAGNOSIS — Z66 Do not resuscitate: Secondary | ICD-10-CM | POA: Diagnosis not present

## 2016-05-07 DIAGNOSIS — J9600 Acute respiratory failure, unspecified whether with hypoxia or hypercapnia: Secondary | ICD-10-CM | POA: Diagnosis not present

## 2016-05-07 DIAGNOSIS — R4182 Altered mental status, unspecified: Secondary | ICD-10-CM

## 2016-05-07 DIAGNOSIS — E875 Hyperkalemia: Secondary | ICD-10-CM | POA: Diagnosis present

## 2016-05-07 DIAGNOSIS — I10 Essential (primary) hypertension: Secondary | ICD-10-CM | POA: Diagnosis present

## 2016-05-07 DIAGNOSIS — R1031 Right lower quadrant pain: Secondary | ICD-10-CM

## 2016-05-07 DIAGNOSIS — E874 Mixed disorder of acid-base balance: Secondary | ICD-10-CM | POA: Diagnosis present

## 2016-05-07 DIAGNOSIS — I959 Hypotension, unspecified: Secondary | ICD-10-CM | POA: Insufficient documentation

## 2016-05-07 DIAGNOSIS — J9690 Respiratory failure, unspecified, unspecified whether with hypoxia or hypercapnia: Secondary | ICD-10-CM

## 2016-05-07 DIAGNOSIS — R6521 Severe sepsis with septic shock: Secondary | ICD-10-CM | POA: Diagnosis present

## 2016-05-07 DIAGNOSIS — R112 Nausea with vomiting, unspecified: Secondary | ICD-10-CM | POA: Diagnosis not present

## 2016-05-07 DIAGNOSIS — Z7982 Long term (current) use of aspirin: Secondary | ICD-10-CM | POA: Diagnosis not present

## 2016-05-07 DIAGNOSIS — E872 Acidosis: Secondary | ICD-10-CM | POA: Diagnosis not present

## 2016-05-07 DIAGNOSIS — T380X5A Adverse effect of glucocorticoids and synthetic analogues, initial encounter: Secondary | ICD-10-CM | POA: Diagnosis present

## 2016-05-07 DIAGNOSIS — Z79899 Other long term (current) drug therapy: Secondary | ICD-10-CM

## 2016-05-07 DIAGNOSIS — Z4659 Encounter for fitting and adjustment of other gastrointestinal appliance and device: Secondary | ICD-10-CM

## 2016-05-07 DIAGNOSIS — N200 Calculus of kidney: Secondary | ICD-10-CM | POA: Diagnosis not present

## 2016-05-07 HISTORY — PX: CYSTOSCOPY WITH RETROGRADE PYELOGRAM, URETEROSCOPY AND STENT PLACEMENT: SHX5789

## 2016-05-07 LAB — BLOOD GAS, ARTERIAL
Acid-base deficit: 7.4 mmol/L — ABNORMAL HIGH (ref 0.0–2.0)
BICARBONATE: 18.1 meq/L — AB (ref 21.0–28.0)
FIO2: 0.6
MECHANICAL RATE: 15
O2 Saturation: 95.8 %
PATIENT TEMPERATURE: 37
PEEP: 5 cmH2O
PO2 ART: 88 mmHg (ref 83.0–108.0)
VT: 500 mL
pCO2 arterial: 36 mmHg (ref 32.0–48.0)
pH, Arterial: 7.31 — ABNORMAL LOW (ref 7.350–7.450)

## 2016-05-07 LAB — CBC WITH DIFFERENTIAL/PLATELET
Basophils Absolute: 0 10*3/uL (ref 0–0.1)
Basophils Relative: 0 %
EOS ABS: 0 10*3/uL (ref 0–0.7)
HCT: 42.8 % (ref 40.0–52.0)
Hemoglobin: 14.2 g/dL (ref 13.0–18.0)
LYMPHS ABS: 1.4 10*3/uL (ref 1.0–3.6)
MCH: 30.3 pg (ref 26.0–34.0)
MCHC: 33.1 g/dL (ref 32.0–36.0)
MCV: 91.5 fL (ref 80.0–100.0)
MONO ABS: 0.9 10*3/uL (ref 0.2–1.0)
Neutro Abs: 35.6 10*3/uL — ABNORMAL HIGH (ref 1.4–6.5)
Neutrophils Relative %: 94 %
PLATELETS: 310 10*3/uL (ref 150–440)
RBC: 4.68 MIL/uL (ref 4.40–5.90)
RDW: 16.3 % — ABNORMAL HIGH (ref 11.5–14.5)
WBC: 38 10*3/uL — AB (ref 3.8–10.6)

## 2016-05-07 LAB — APTT: aPTT: 28 seconds (ref 24–36)

## 2016-05-07 LAB — TROPONIN I
Troponin I: <0.03 ng/mL (ref <0.031)
Troponin I: 0.03 ng/mL (ref <0.031)

## 2016-05-07 LAB — COMPREHENSIVE METABOLIC PANEL
ALBUMIN: 3.6 g/dL (ref 3.5–5.0)
ALK PHOS: 118 U/L (ref 38–126)
ALT: 16 U/L — AB (ref 17–63)
AST: 33 U/L (ref 15–41)
Anion gap: 13 (ref 5–15)
BILIRUBIN TOTAL: 1.8 mg/dL — AB (ref 0.3–1.2)
BUN: 32 mg/dL — AB (ref 6–20)
CALCIUM: 9 mg/dL (ref 8.9–10.3)
CO2: 22 mmol/L (ref 22–32)
CREATININE: 3.16 mg/dL — AB (ref 0.61–1.24)
Chloride: 101 mmol/L (ref 101–111)
GFR calc Af Amer: 21 mL/min — ABNORMAL LOW (ref >60)
GFR calc non Af Amer: 18 mL/min — ABNORMAL LOW (ref >60)
GLUCOSE: 238 mg/dL — AB (ref 65–99)
Potassium: 6.8 mmol/L (ref 3.5–5.1)
SODIUM: 136 mmol/L (ref 135–145)
TOTAL PROTEIN: 9.4 g/dL — AB (ref 6.5–8.1)

## 2016-05-07 LAB — PROTIME-INR
INR: 1.11
PROTHROMBIN TIME: 14.5 s (ref 11.4–15.0)

## 2016-05-07 LAB — URINALYSIS COMPLETE WITH MICROSCOPIC (ARMC ONLY)
BILIRUBIN URINE: NEGATIVE
GLUCOSE, UA: NEGATIVE mg/dL
Ketones, ur: NEGATIVE mg/dL
NITRITE: NEGATIVE
Protein, ur: 100 mg/dL — AB
SPECIFIC GRAVITY, URINE: 1.02 (ref 1.005–1.030)
pH: 5 (ref 5.0–8.0)

## 2016-05-07 LAB — BASIC METABOLIC PANEL
ANION GAP: 9 (ref 5–15)
BUN: 35 mg/dL — ABNORMAL HIGH (ref 6–20)
CALCIUM: 7.2 mg/dL — AB (ref 8.9–10.3)
CO2: 19 mmol/L — AB (ref 22–32)
CREATININE: 2.99 mg/dL — AB (ref 0.61–1.24)
Chloride: 111 mmol/L (ref 101–111)
GFR calc non Af Amer: 19 mL/min — ABNORMAL LOW (ref >60)
GFR, EST AFRICAN AMERICAN: 22 mL/min — AB (ref >60)
Glucose, Bld: 183 mg/dL — ABNORMAL HIGH (ref 65–99)
Potassium: 4.9 mmol/L (ref 3.5–5.1)
SODIUM: 139 mmol/L (ref 135–145)

## 2016-05-07 LAB — LACTIC ACID, PLASMA
Lactic Acid, Venous: 2.7 mmol/L (ref 0.5–2.0)
Lactic Acid, Venous: 2.1 mmol/L (ref 0.5–2.0)

## 2016-05-07 LAB — MAGNESIUM: MAGNESIUM: 2.6 mg/dL — AB (ref 1.7–2.4)

## 2016-05-07 LAB — GLUCOSE, CAPILLARY
GLUCOSE-CAPILLARY: 172 mg/dL — AB (ref 65–99)
Glucose-Capillary: 143 mg/dL — ABNORMAL HIGH (ref 65–99)

## 2016-05-07 LAB — PHOSPHORUS: PHOSPHORUS: 3.5 mg/dL (ref 2.5–4.6)

## 2016-05-07 LAB — SURGICAL PCR SCREEN
MRSA, PCR: POSITIVE — AB
STAPHYLOCOCCUS AUREUS: POSITIVE — AB

## 2016-05-07 LAB — LIPASE, BLOOD: Lipase: 20 U/L (ref 11–51)

## 2016-05-07 LAB — TRIGLYCERIDES: Triglycerides: 81 mg/dL (ref <150)

## 2016-05-07 SURGERY — CYSTOURETEROSCOPY, WITH RETROGRADE PYELOGRAM AND STENT INSERTION
Anesthesia: General | Laterality: Right | Wound class: Dirty or Infected

## 2016-05-07 MED ORDER — NOREPINEPHRINE BITARTRATE 1 MG/ML IV SOLN
0.0000 ug/min | INTRAVENOUS | Status: DC
  Administered 2016-05-07: 40 ug/min via INTRAVENOUS
  Administered 2016-05-08 (×2): 75 ug/min via INTRAVENOUS
  Administered 2016-05-08 (×2): 70 ug/min via INTRAVENOUS
  Administered 2016-05-08: 75 ug/min via INTRAVENOUS
  Administered 2016-05-09: 67 ug/min via INTRAVENOUS
  Administered 2016-05-09: 75 ug/min via INTRAVENOUS
  Administered 2016-05-09: 67 ug/min via INTRAVENOUS
  Administered 2016-05-09: 80 ug/min via INTRAVENOUS
  Administered 2016-05-10: 15 ug/min via INTRAVENOUS
  Administered 2016-05-10: 17 ug/min via INTRAVENOUS
  Administered 2016-05-10: 54 ug/min via INTRAVENOUS
  Filled 2016-05-07 (×14): qty 16

## 2016-05-07 MED ORDER — SODIUM CHLORIDE 0.9 % IV SOLN
250.0000 mL | INTRAVENOUS | Status: DC | PRN
Start: 2016-05-07 — End: 2016-05-14

## 2016-05-07 MED ORDER — PRO-STAT SUGAR FREE PO LIQD
30.0000 mL | Freq: Two times a day (BID) | ORAL | Status: DC
  Administered 2016-05-08 – 2016-05-11 (×4): 30 mL

## 2016-05-07 MED ORDER — NOREPINEPHRINE BITARTRATE 1 MG/ML IV SOLN
0.0000 ug/min | Freq: Once | INTRAVENOUS | Status: AC
  Administered 2016-05-07: 2 ug/min via INTRAVENOUS
  Filled 2016-05-07: qty 4

## 2016-05-07 MED ORDER — PIPERACILLIN-TAZOBACTAM 3.375 G IVPB 30 MIN
3.3750 g | Freq: Once | INTRAVENOUS | Status: AC
  Administered 2016-05-07: 3.375 g via INTRAVENOUS
  Filled 2016-05-07: qty 50

## 2016-05-07 MED ORDER — SENNOSIDES 8.8 MG/5ML PO SYRP: 5.0000 mL | ORAL_SOLUTION | Freq: Two times a day (BID) | ORAL | Status: DC | PRN

## 2016-05-07 MED ORDER — PROPOFOL 1000 MG/100ML IV EMUL: 0.0000 ug/kg/min | INTRAVENOUS | Status: DC

## 2016-05-07 MED ORDER — HEPARIN SODIUM (PORCINE) 5000 UNIT/ML IJ SOLN
5000.0000 [IU] | Freq: Three times a day (TID) | INTRAMUSCULAR | Status: DC
  Administered 2016-05-07 – 2016-05-12 (×14): 5000 [IU] via SUBCUTANEOUS
  Filled 2016-05-07 (×14): qty 1

## 2016-05-07 MED ORDER — MIDAZOLAM HCL 2 MG/2ML IJ SOLN
1.0000 mg | INTRAMUSCULAR | Status: AC | PRN
  Administered 2016-05-10 – 2016-05-11 (×3): 1 mg via INTRAVENOUS
  Filled 2016-05-07 (×2): qty 2

## 2016-05-07 MED ORDER — VITAL HIGH PROTEIN PO LIQD: 1000.0000 mL | ORAL | Status: DC

## 2016-05-07 MED ORDER — VANCOMYCIN HCL IN DEXTROSE 1-5 GM/200ML-% IV SOLN
1000.0000 mg | INTRAVENOUS | Status: DC
  Administered 2016-05-07 – 2016-05-08 (×2): 1000 mg via INTRAVENOUS
  Filled 2016-05-07 (×3): qty 200

## 2016-05-07 MED ORDER — SODIUM CHLORIDE 0.9 % IV SOLN
INTRAVENOUS | Status: DC | PRN
  Administered 2016-05-07: 17:00:00 via INTRAVENOUS

## 2016-05-07 MED ORDER — VASOPRESSIN 20 UNIT/ML IV SOLN
100.0000 [IU] | INTRAVENOUS | Status: DC | PRN
  Administered 2016-05-07: 0.4 [IU]/min via INTRAVENOUS

## 2016-05-07 MED ORDER — FENTANYL BOLUS VIA INFUSION
25.0000 ug | INTRAVENOUS | Status: DC | PRN
  Administered 2016-05-08 – 2016-05-13 (×14): 25 ug via INTRAVENOUS
  Filled 2016-05-07: qty 25

## 2016-05-07 MED ORDER — VASOPRESSIN 20 UNIT/ML IV SOLN
0.0300 [IU]/min | INTRAVENOUS | Status: DC
  Administered 2016-05-08 – 2016-05-10 (×4): 0.03 [IU]/min via INTRAVENOUS
  Filled 2016-05-07 (×5): qty 2

## 2016-05-07 MED ORDER — MIDAZOLAM HCL 2 MG/2ML IJ SOLN
1.0000 mg | INTRAMUSCULAR | Status: DC | PRN
  Administered 2016-05-10: 1 mg via INTRAVENOUS
  Filled 2016-05-07 (×2): qty 2

## 2016-05-07 MED ORDER — FENTANYL CITRATE (PF) 100 MCG/2ML IJ SOLN
50.0000 ug | Freq: Once | INTRAMUSCULAR | Status: AC
  Administered 2016-05-07: 50 ug via INTRAVENOUS

## 2016-05-07 MED ORDER — NOREPINEPHRINE 4 MG/250ML-% IV SOLN
INTRAVENOUS | Status: AC
  Administered 2016-05-07: 10 ug/min via INTRAVENOUS
  Administered 2016-05-07: 4 mg
  Filled 2016-05-07: qty 250

## 2016-05-07 MED ORDER — ROCURONIUM BROMIDE 100 MG/10ML IV SOLN
INTRAVENOUS | Status: DC | PRN
  Administered 2016-05-07: 10 mg via INTRAVENOUS

## 2016-05-07 MED ORDER — FAMOTIDINE IN NACL 20-0.9 MG/50ML-% IV SOLN
20.0000 mg | INTRAVENOUS | Status: DC
  Administered 2016-05-07 – 2016-05-10 (×4): 20 mg via INTRAVENOUS
  Filled 2016-05-07 (×5): qty 50

## 2016-05-07 MED ORDER — ANTISEPTIC ORAL RINSE SOLUTION (CORINZ)
7.0000 mL | OROMUCOSAL | Status: DC
  Administered 2016-05-08 – 2016-05-13 (×54): 7 mL via OROMUCOSAL
  Filled 2016-05-07 (×60): qty 7

## 2016-05-07 MED ORDER — NOREPINEPHRINE 4 MG/250ML-% IV SOLN
2.0000 ug/min | INTRAVENOUS | Status: DC
  Administered 2016-05-07: 30 ug/min via INTRAVENOUS

## 2016-05-07 MED ORDER — METHYLPREDNISOLONE SODIUM SUCC 125 MG IJ SOLR
60.0000 mg | Freq: Four times a day (QID) | INTRAMUSCULAR | Status: DC
  Administered 2016-05-08: 60 mg via INTRAVENOUS
  Filled 2016-05-07: qty 2

## 2016-05-07 MED ORDER — ACETAMINOPHEN 325 MG PO TABS: 650.0000 mg | ORAL_TABLET | ORAL | Status: DC | PRN

## 2016-05-07 MED ORDER — PHENYLEPHRINE HCL 10 MG/ML IJ SOLN
INTRAMUSCULAR | Status: DC | PRN
  Administered 2016-05-07 (×3): 200 ug via INTRAVENOUS
  Administered 2016-05-07 (×2): 300 ug via INTRAVENOUS
  Administered 2016-05-07: 200 ug via INTRAVENOUS

## 2016-05-07 MED ORDER — PHENYLEPHRINE HCL 10 MG/ML IJ SOLN
30.0000 ug/min | INTRAVENOUS | Status: DC
  Administered 2016-05-08: 40 ug/min via INTRAVENOUS
  Administered 2016-05-08: 50 ug/min via INTRAVENOUS
  Administered 2016-05-08: 30 ug/min via INTRAVENOUS
  Filled 2016-05-07 (×6): qty 1

## 2016-05-07 MED ORDER — SUCCINYLCHOLINE CHLORIDE 20 MG/ML IJ SOLN
INTRAMUSCULAR | Status: DC | PRN
  Administered 2016-05-07: 140 mg via INTRAVENOUS

## 2016-05-07 MED ORDER — SODIUM CHLORIDE 0.9 % IV SOLN
INTRAVENOUS | Status: DC
  Administered 2016-05-07 – 2016-05-09 (×4): via INTRAVENOUS

## 2016-05-07 MED ORDER — SODIUM CHLORIDE 0.9 % IV BOLUS (SEPSIS)
1000.0000 mL | Freq: Once | INTRAVENOUS | Status: AC
  Administered 2016-05-07: 1000 mL via INTRAVENOUS

## 2016-05-07 MED ORDER — LIDOCAINE HCL (CARDIAC) 20 MG/ML IV SOLN
INTRAVENOUS | Status: DC | PRN
  Administered 2016-05-07: 50 mg via INTRAVENOUS

## 2016-05-07 MED ORDER — FENTANYL CITRATE (PF) 100 MCG/2ML IJ SOLN: 50.0000 ug | INTRAMUSCULAR | Status: DC | PRN

## 2016-05-07 MED ORDER — SODIUM CHLORIDE 0.9 % IV BOLUS (SEPSIS)
1000.0000 mL | Freq: Once | INTRAVENOUS | Status: AC
  Administered 2016-05-08: 1000 mL via INTRAVENOUS

## 2016-05-07 MED ORDER — FENTANYL CITRATE (PF) 100 MCG/2ML IJ SOLN
INTRAMUSCULAR | Status: DC | PRN
  Administered 2016-05-07: 100 ug via INTRAVENOUS

## 2016-05-07 MED ORDER — SODIUM CHLORIDE 0.9 % IV BOLUS (SEPSIS): 500.0000 mL | INTRAVENOUS | Status: DC | PRN

## 2016-05-07 MED ORDER — VASOPRESSIN 20 UNIT/ML IV SOLN
INTRAVENOUS | Status: DC | PRN
  Administered 2016-05-07 (×2): 4 [IU] via INTRAVENOUS
  Administered 2016-05-07: 6 [IU] via INTRAVENOUS
  Administered 2016-05-07: 2 [IU] via INTRAVENOUS
  Administered 2016-05-07: 4 [IU] via INTRAVENOUS

## 2016-05-07 MED ORDER — BISACODYL 10 MG RE SUPP: 10.0000 mg | Freq: Every day | RECTAL | Status: DC | PRN

## 2016-05-07 MED ORDER — INSULIN ASPART 100 UNIT/ML ~~LOC~~ SOLN
2.0000 [IU] | SUBCUTANEOUS | Status: DC
  Administered 2016-05-08 (×2): 6 [IU] via SUBCUTANEOUS
  Administered 2016-05-08: 4 [IU] via SUBCUTANEOUS
  Filled 2016-05-07 (×2): qty 4
  Filled 2016-05-07 (×3): qty 6

## 2016-05-07 MED ORDER — FENTANYL CITRATE (PF) 100 MCG/2ML IJ SOLN
50.0000 ug | INTRAMUSCULAR | Status: DC | PRN
  Administered 2016-05-08: 50 ug via INTRAVENOUS

## 2016-05-07 MED ORDER — VANCOMYCIN HCL IN DEXTROSE 1-5 GM/200ML-% IV SOLN
1000.0000 mg | Freq: Once | INTRAVENOUS | Status: AC
  Administered 2016-05-07: 1000 mg via INTRAVENOUS
  Filled 2016-05-07: qty 200

## 2016-05-07 MED ORDER — METHYLPREDNISOLONE SODIUM SUCC 125 MG IJ SOLR
125.0000 mg | Freq: Once | INTRAMUSCULAR | Status: AC
  Administered 2016-05-07: 125 mg via INTRAVENOUS
  Filled 2016-05-07: qty 2

## 2016-05-07 MED ORDER — FENTANYL 2500MCG IN NS 250ML (10MCG/ML) PREMIX INFUSION
25.0000 ug/h | INTRAVENOUS | Status: DC
  Administered 2016-05-07: 50 ug/h via INTRAVENOUS
  Administered 2016-05-08: 150 ug/h via INTRAVENOUS
  Administered 2016-05-09: 100 ug/h via INTRAVENOUS
  Administered 2016-05-10: 125 ug/h via INTRAVENOUS
  Administered 2016-05-11: 150 ug/h via INTRAVENOUS
  Administered 2016-05-11: 200 ug/h via INTRAVENOUS
  Administered 2016-05-12: 100 ug/h via INTRAVENOUS
  Administered 2016-05-13: 75 ug/h via INTRAVENOUS
  Filled 2016-05-07 (×8): qty 250

## 2016-05-07 MED ORDER — NOREPINEPHRINE BITARTRATE 1 MG/ML IV SOLN
0.0000 ug/min | Freq: Once | INTRAVENOUS | Status: AC
  Administered 2016-05-07: 30 ug/min via INTRAVENOUS
  Filled 2016-05-07: qty 4

## 2016-05-07 MED ORDER — SODIUM CHLORIDE 0.9 % IV SOLN
2.0000 ug/min | INTRAVENOUS | Status: DC
  Filled 2016-05-07: qty 4

## 2016-05-07 MED ORDER — ETOMIDATE 2 MG/ML IV SOLN
INTRAVENOUS | Status: DC | PRN
  Administered 2016-05-07: 16 mg via INTRAVENOUS

## 2016-05-07 MED ORDER — CHLORHEXIDINE GLUCONATE 0.12% ORAL RINSE (MEDLINE KIT)
15.0000 mL | Freq: Two times a day (BID) | OROMUCOSAL | Status: DC
  Administered 2016-05-08 – 2016-05-13 (×12): 15 mL via OROMUCOSAL
  Filled 2016-05-07 (×13): qty 15

## 2016-05-07 MED ORDER — PIPERACILLIN-TAZOBACTAM 3.375 G IVPB
3.3750 g | Freq: Three times a day (TID) | INTRAVENOUS | Status: DC
Start: 2016-05-07 — End: 2016-05-11
  Administered 2016-05-07 – 2016-05-11 (×11): 3.375 g via INTRAVENOUS
  Filled 2016-05-07 (×14): qty 50

## 2016-05-07 SURGICAL SUPPLY — 23 items
BAG DRAIN CYSTO-URO LG1000N (MISCELLANEOUS) ×3 IMPLANT
BAG URO DRAIN 2000ML W/SPOUT (MISCELLANEOUS) ×3 IMPLANT
CATH FOL 2WAY LX 16X5 (CATHETERS) IMPLANT
CATH FOL 2WAY LX 22X5 (CATHETERS) ×3 IMPLANT
CATH URETL 5X70 OPEN END (CATHETERS) ×3 IMPLANT
CONRAY 43 FOR UROLOGY 50M (MISCELLANEOUS) ×3 IMPLANT
GLOVE BIO SURGEON STRL SZ7 (GLOVE) ×6 IMPLANT
GLOVE BIO SURGEON STRL SZ8 (GLOVE) ×3 IMPLANT
GOWN STRL REUS W/ TWL LRG LVL3 (GOWN DISPOSABLE) ×2 IMPLANT
GOWN STRL REUS W/TWL LRG LVL3 (GOWN DISPOSABLE) ×4
GUIDEWIRE STR ZIPWIRE 035X150 (MISCELLANEOUS) ×3 IMPLANT
KIT CATH CVC 3 LUMEN 7FR 8IN (MISCELLANEOUS) ×3 IMPLANT
KIT RM TURNOVER CYSTO AR (KITS) ×3 IMPLANT
PACK CYSTO AR (MISCELLANEOUS) ×3 IMPLANT
PREP PVP WINGED SPONGE (MISCELLANEOUS) IMPLANT
SET CYSTO W/LG BORE CLAMP LF (SET/KITS/TRAYS/PACK) ×3 IMPLANT
SOL .9 NS 3000ML IRR  AL (IV SOLUTION) ×2
SOL .9 NS 3000ML IRR UROMATIC (IV SOLUTION) ×1 IMPLANT
STENT URET 6FRX24 CONTOUR (STENTS) IMPLANT
STENT URET 6FRX26 CONTOUR (STENTS) ×3 IMPLANT
SURGILUBE 2OZ TUBE FLIPTOP (MISCELLANEOUS) ×3 IMPLANT
SYRINGE IRR TOOMEY STRL 70CC (SYRINGE) IMPLANT
WATER STERILE IRR 1000ML POUR (IV SOLUTION) ×3 IMPLANT

## 2016-05-07 NOTE — Anesthesia Procedure Notes (Addendum)
Procedure Name: Intubation Date/Time: 04-Mar-2016 4:47 PM Performed by: Ginger CarneMICHELET, Kiel Cockerell Pre-anesthesia Checklist: Patient identified, Emergency Drugs available, Suction available, Patient being monitored and Timeout performed Patient Re-evaluated:Patient Re-evaluated prior to inductionOxygen Delivery Method: Circle system utilized Preoxygenation: Pre-oxygenation with 100% oxygen Intubation Type: IV induction, Rapid sequence and Cricoid Pressure applied Laryngoscope Size: Miller and 2 Grade View: Grade I Tube type: Oral Tube size: 7.5 mm Number of attempts: 1 Airway Equipment and Method: Stylet Placement Confirmation: ETT inserted through vocal cords under direct vision,  positive ETCO2 and breath sounds checked- equal and bilateral Secured at: 22 cm Tube secured with: Tape Dental Injury: Teeth and Oropharynx as per pre-operative assessment    Central Venous Catheter Insertion Performed by: anesthesiologist 04-Mar-2016 5:30 PM Patient location: Pre-op. Emergency situation Preanesthetic checklist: patient identified, IV checked, site marked, risks and benefits discussed, surgical consent, monitors and equipment checked, pre-op evaluation, timeout performed and anesthesia consent Position: Trendelenburg Landmarks identified and Seldinger technique used Catheter size: 7 Fr Central line was placed.Triple lumen Procedure performed using ultrasound guided technique. Attempts: 1 Following insertion, dressing applied, line sutured and Biopatch. Post procedure assessment: blood return through all ports. Patient tolerated the procedure well with no immediate complications.

## 2016-05-07 NOTE — ED Provider Notes (Signed)
[facility] Emergency Department Provider Note  ____________________________________________  Time seen: Approximately 13:45  I have reviewed the triage vital signs and the nursing notes.   HISTORY  Chief Complaint Altered Mental Status  History limited by critical illness, AMS, and chronic dementia  HPI Nathan Figueroa is a 75 y.o. male with multiple chronic medical conditions and he was recently admitted for sepsis thought to be due to a urinary tract infection (within the last 2 weeks) who presents by EMS forbeing pale and responsive only to pain.  He comes from [facility] and apparently has gotten significantly worse over the last day.  He was on Levaquin for outpatient pneumonia and UTI treatment.  His cultures from his prior hospitalization or nondiagnostic and possibly contamination including both staph and Proteus in the blood cultures.  Reportedly he improved for a while but has gotten much worse today.  He arrives as a code sepsis.  He is yelling out occasionally moaning but otherwise not particularly responsive.   Past Medical History  Diagnosis Date  . Hypertension   . History of rhabdomyolysis   . Weakness   . Vitamin disease   . Muscle weakness (generalized)   . Special sensory attacks (HCC)   . Urinary tract infection, site not specified   . Anemia, unspecified   . Thiamin deficiency   . Acute bronchitis   . Epidemic vomiting syndrome   . Change of dressing   . Paroxysmal tachycardia, unspecified [facility])     Patient Active Problem List   Diagnosis Date Noted  . Sepsis (HCC) 04/15/2016    History reviewed. No pertinent past surgical history.  Current Outpatient Rx  Name  Route  Sig  Dispense  Refill  . acetaminophen (TYLENOL) 500 MG tablet   Oral   Take 500 mg by mouth every 4 (four) hours as needed for fever. > 100 degrees x 24 hours.         Marland Kitchen acetaminophen (TYLENOL) 650 MG CR tablet   Oral   Take 650 mg by mouth 2  (two) times daily.         Marland Kitchen aspirin EC 81 MG EC tablet   Oral   Take 1 tablet (81 mg total) by mouth daily.   120 tablet   0   . carvedilol (COREG) 12.5 MG tablet   Oral   Take 1 tablet (12.5 mg total) by mouth 2 (two) times daily with a meal.   60 tablet   0   . feeding supplement (BOOST / RESOURCE BREEZE) LIQD   Oral   Take 1 Container by mouth 2 (two) times daily between meals.   1 Container   0   . gabapentin (NEURONTIN) 100 MG capsule   Oral   Take 200 mg by mouth 2 (two) times daily.         Marland Kitchen levofloxacin (LEVAQUIN) 500 MG tablet   Oral   Take 500 mg by mouth daily.         Marland Kitchen lisinopril (PRINIVIL,ZESTRIL) 5 MG tablet   Oral   Take 1 tablet (5 mg total) by mouth daily.   30 tablet   0   . Multiple Vitamin (MULTIVITAMIN) tablet   Oral   Take 1 tablet by mouth daily.         Marland Kitchen nystatin (MYCOSTATIN) 100000 UNIT/ML suspension   Oral   Take 5 mLs (500,000 Units total) by mouth 4 (four) times daily.   60 mL  0   . traMADol (ULTRAM) 50 MG tablet   Oral   Take 50 mg by mouth 3 (three) times daily as needed for moderate pain or severe pain.         . cefUROXime (CEFTIN) 500 MG tablet   Oral   Take 1 tablet (500 mg total) by mouth 2 (two) times daily with a meal.   12 tablet   0   . ciprofloxacin (CIPRO) 250 MG tablet   Oral   Take 1 tablet (250 mg total) by mouth 2 (two) times daily.   28 tablet   0   . diltiazem (CARDIZEM CD) 120 MG 24 hr capsule   Oral   Take 1 capsule (120 mg total) by mouth daily.   60 capsule   0     Allergies Review of patient's allergies indicates no known allergies.  History reviewed. No pertinent family history.  Social History Social History  Substance Use Topics  . Smoking status: Never Smoker   . Smokeless tobacco: None  . Alcohol Use: None    Review of Systems Unable to obtain - critical care caveat ____________________________________________   PHYSICAL EXAM:  VITAL SIGNS: ED Triage Vitals   Enc Vitals Group     BP --      Pulse Rate 05/03/2016 1342 88     Resp 05/09/2016 1342 27     Temp --      Temp src --      SpO2 --      Weight 05/16/2016 1342 201 lb 1.6 oz (91.218 kg)     Height 04/20/2016 1342 6' (1.829 m)     Head Cir --      Peak Flow --      Pain Score --      Pain Loc --      Pain Edu? --      Excl. in GC? --     Constitutional: Toxic appearance, moaning incoherently, pale, hypotensive Head: Atraumatic. Nose: No congestion/rhinnorhea. Mouth/Throat: Mucous membranes are dry. Neck: No stridor.  No meningeal signs.   Cardiovascular: Normal rate, regular rhythm.  Poor peripheral circulation. Grossly normal heart sounds.   Respiratory: Normal respiratory effort but with mild tachypnea.  No retractions. Lungs CTAB. Gastrointestinal: Soft, mild distention, tenderness to palpation throughout.  Musculoskeletal: No lower extremity tenderness nor edema. No gross deformities of extremities. Neurologic:  Moving all 4 extremities but unable to participate in neurological exam due to critical illness Skin:  Skin is pale, warm, dry and intact. No rash noted.   ____________________________________________   LABS (all labs ordered are listed, but only abnormal results are displayed)  Labs Reviewed  LACTIC ACID, PLASMA - Abnormal; Notable for the following:    Lactic Acid, Venous 2.7 (*)    All other components within normal limits  COMPREHENSIVE METABOLIC PANEL - Abnormal; Notable for the following:    Potassium 6.8 (*)    Glucose, Bld 238 (*)    BUN 32 (*)    Creatinine, Ser 3.16 (*)    Total Protein 9.4 (*)    ALT 16 (*)    Total Bilirubin 1.8 (*)    GFR calc non Af Amer 18 (*)    GFR calc Af Amer 21 (*)    All other components within normal limits  CBC WITH DIFFERENTIAL/PLATELET - Abnormal; Notable for the following:    WBC 38.0 (*)    RDW 16.3 (*)    Neutro Abs 35.6 (*)    All  other components within normal limits  BLOOD GAS, ARTERIAL - Abnormal; Notable for  the following:    pCO2 arterial 30 (*)    pO2, Arterial 80 (*)    Bicarbonate 19.5 (*)    Acid-base deficit 3.9 (*)    All other components within normal limits  URINALYSIS COMPLETEWITH MICROSCOPIC ([facility] ONLY) - Abnormal; Notable for the following:    Color, Urine AMBER (*)    APPearance TURBID (*)    Hgb urine dipstick 1+ (*)    Protein, ur 100 (*)    Leukocytes, UA 3+ (*)    Bacteria, UA RARE (*)    Squamous Epithelial / LPF 0-5 (*)    All other components within normal limits  CULTURE, BLOOD (ROUTINE X 2)  CULTURE, BLOOD (ROUTINE X 2)  URINE CULTURE  LIPASE, BLOOD  TROPONIN I  APTT  PROTIME-INR  LACTIC ACID, PLASMA   ____________________________________________  EKG  ED ECG REPORT I, Milla Wahlberg, the attending physician, personally viewed and interpreted this ECG.   Date: 05/05/2016  EKG Time: 13:51  Rate: 83  Rhythm: Normal sinus rhythm  Axis: Normal  Intervals:Normal  ST&T Change: Non-specific ST segment / T-wave changes, but no evidence of acute ischemia.   ____________________________________________  RADIOLOGY   Ct Abdomen Pelvis Wo Contrast  04/24/2016  CLINICAL DATA:  Pt very poor historian. Pt states that his belly hurts. Per note in pt chart "Pt presents to ED from [facility] with a c/o altered mental status and code sepsis. Per EMS pt was hospitalized for a week for pneumonia and was started on levaquin yesterday. Per EMS pt is normally alert and oriented, presents to ED confused and per EMS combative. Pt presents to ED with staff moaning and with incomprehensible speech. Pt noted to be pale on arrival." No IV contrast per GFR value, pt unable to drink PO contrast. EXAM: CT ABDOMEN AND PELVIS WITHOUT CONTRAST TECHNIQUE: Multidetector CT imaging of the abdomen and pelvis was performed following the standard protocol without IV contrast. COMPARISON:  None. FINDINGS: Lung bases: Right greater than left dependent linear and reticular type opacities likely  due to atelectasis, scarring or a combination. Heart is normal in size. Hepatobiliary: Fatty infiltration of the liver. Low-density homogeneous mass in the right lobe adjacent to the gallbladder measuring 2 cm consistent with a cyst. No other liver masses or lesions. Gallbladder is abnormal appearance. It has increased attenuation material throughout with evidence of wall thickening and adjacent inflammation. No bile duct dilation. Spleen, pancreas, adrenal glands:  Unremarkable. Kidneys, ureters, bladder: Mild right hydronephrosis with hydroureter extending to the right mid to distal ureter at the level of the pelvic ran. There are 2 adjacent stones in this location, larger measuring 6 mm. There are no additional stones below this. There are multiple nonobstructing intrarenal stones on the right. No left intrarenal stones. There are low-density renal masses. There is a 4.5 cm midpole mass in the right and a 2.6 cm midpole mass on the left, both consistent with cysts. No left hydronephrosis. Normal left ureter. Bladder is unremarkable. There changes from previous prostate surgery. Lymph nodes:  No pathologically enlarged lymph nodes. Ascites:  None. Gastrointestinal: There is significant distention of the rectum with stool. There is some mild hazy inflammatory changes adjacent to the rectum and lower sigmoid colon. Scattered diverticula noted along the sigmoid colon. No evidence of diverticulitis. There is no evidence of bowel obstruction. There is no bowel wall thickening. Stomach is unremarkable. Normal appendix visualized. Musculoskeletal: There  arthropathic changes of both hips. Degenerative changes are noted along the visualized spine. No osteoblastic or osteolytic lesions. IMPRESSION: 1. Two adjacent stones lie in the mid to distal right ureter, largest measuring 6 mm, causing mild right hydroureteronephrosis. 2. Gallbladder also appears abnormal with increased attenuation material and evidence of wall  thickening and adjacent inflammatory change. Consider acute cholecystitis if this correlates clinically, which could be further assessed with limited right upper quadrant ultrasound. 3. Rectum is significantly distended with stool and there is mild hazy inflammatory change in the perirectal fat in the fat adjacent to the lower sigmoid colon. Consider proctitis if this patient has rectal/anal pain. 4. No other acute findings. 5. Hepatic steatosis. Hepatic cyst. Bilateral renal cysts. Nonobstructing stones in the right kidney. Sigmoid colon diverticula without diverticulitis. Arthropathic changes of the hips and degenerative changes of spine. Electronically Signed   By: Amie Portlandavid  Ormond M.D.   On: 04/26/2016 15:26   Dg Chest Port 1 View  05/06/2016  CLINICAL DATA:  Sepsis. EXAM: PORTABLE CHEST 1 VIEW COMPARISON:  Radiograph of Apr 17, 2016. FINDINGS: Stable cardiomediastinal silhouette. Right hilar clips are noted. No pneumothorax or pleural effusion is noted. No acute pulmonary disease is noted. Bony thorax is unremarkable. IMPRESSION: No acute cardiopulmonary abnormality seen. Electronically Signed   By: Lupita RaiderJames  Green Jr, M.D.   On: 05/10/2016 14:29    ____________________________________________   PROCEDURES  Procedure(s) performed:   .Critical Care Performed by: Loleta RoseFORBACH, Jerolene Kupfer Authorized by: Loleta RoseFORBACH, Rai Sinagra Total critical care time: 75 minutes Critical care time was exclusive of separately billable procedures and treating other patients. Critical care was necessary to treat or prevent imminent or life-threatening deterioration of the following conditions: cardiac failure, circulatory failure, dehydration, metabolic crisis, endocrine crisis, renal failure, respiratory failure and sepsis. Critical care was time spent personally by me on the following activities: development of treatment plan with patient or surrogate, discussions with consultants, evaluation of patient's response to treatment,  examination of patient, obtaining history from patient or surrogate, ordering and performing treatments and interventions, ordering and review of laboratory studies, ordering and review of radiographic studies, pulse oximetry, re-evaluation of patient's condition and review of old charts.     ____________________________________________   INITIAL IMPRESSION / ASSESSMENT AND PLAN / ED COURSE  Pertinent labs & imaging results that were available during my care of the patient were reviewed by me and considered in my medical decision making (see chart for details).  Toxic appearing, hypotensive, probable sepsis.  Called  Code sepsis, aggressive empiric treatment, will reassess frequently.   (Note that documentation was delayed due to multiple ED patients requiring immediate care.)  I spoke by phone with the patient's emergency contact to claims she is the power of attorney.  She says that she has a sister.  She confirmed that the patient is full code and has no DO NOT RESUSCITATE in place.  I spoke with the patient and he is slightly more alert at this time.  He confirms that he wants everything done for him.  He remains hypotensive although his MAP is around 65.  I am attempting to provide full fluid resuscitation before starting pressors.  His labs are notable for a urinary tract infection and urine that "looked like mayonnaise" according to the nurse that performed the in and out catheterization.  He has a leukocytosis of 38 and acute renal failure.  This appears to be septic shock with multisystem organ failure.  Will check non-contrast CT abd/pelvis given abdominal pain and concern  for possible intraabdominal source.   ----------------------------------------- 3:59 PM on 05/09/2016 -----------------------------------------  Discovered two infected, impacted ureteral stones.  Spoke by phone with urology (Dr. Ronne Binning) who is contacting the OR for emergent stent.  After 45 minutes of repeated  paging, spoke by phone with Dr. Elesa Massed with ICU (reportedly pager was not working).  He said he will contact the local ICU provider to have the patient admitted directly from the OR.  We will update him with an ETA on the OR procedure.  Attempted to contact family again to give update but there was no answer.  Patient's BP currently stable with a MAP in upper 60s.  ----------------------------------------- 4:14 PM on 05/06/2016 -----------------------------------------  I spoke by phone with Dr. Darrol Angel, the local ICU doctor on call.  He had been updated by the intensivist with whom I spoke by phone.  He is approximately 30 minutes away from the hospital.  I gave him the number for the anesthesiologist and he is going to speak with anesthesiologist to see if they will put in a triple-lumen catheter while he is in the operating room.  He will admit the patient and taken to the ICU after the procedure.  I phone with the OR nurse and explained the situation and they are sending an orderly to bring him to the operating room right now for emergent injury.  Dr. Ronne Binning is on his way and should be here shortly.  Patient's blood pressure is consistently low in the mid 70s over mid 60s.  He continues to mentate at the baseline level of when he arrived, which is occasionally yelling out that he is cold and for [place] to leave him alone.  ____________________________________________  FINAL CLINICAL IMPRESSION(S) / ED DIAGNOSES  Final diagnoses:  Septic shock (HCC)  UTI (lower urinary tract infection)  Hypotension, unspecified hypotension type  Altered mental status, unspecified altered mental status type  Acute renal failure, unspecified acute renal failure type (HCC)  Hyperkalemia  Elevated lactic acid level  Ureteral stone with hydronephrosis     MEDICATIONS GIVEN DURING THIS VISIT:  Medications  norepinephrine (LEVOPHED) 4-5 MG/250ML-% infusion (4 mg  Pending 05/06/2016 1615)  sodium chloride 0.9 % bolus  1,000 mL (0 mLs Intravenous Stopped 05/06/2016 1529)    And  sodium chloride 0.9 % bolus 1,000 mL (1,000 mLs Intravenous New Bag/Given 05/19/2016 1418)    And  sodium chloride 0.9 % bolus 1,000 mL (1,000 mLs Intravenous New Bag/Given 05/13/2016 1418)  piperacillin-tazobactam (ZOSYN) IVPB 3.375 g (0 g Intravenous Stopped 04/21/2016 1457)  vancomycin (VANCOCIN) IVPB 1000 mg/200 mL premix (1,000 mg Intravenous New Bag/Given 05/06/2016 1436)  norepinephrine (LEVOPHED) 4 mg in dextrose 5 % 250 mL (0.016 mg/mL) infusion (3 mcg/min Intravenous Rate/Dose Change 05/19/2016 1622)     NEW OUTPATIENT MEDICATIONS STARTED DURING THIS VISIT:  New Prescriptions   No medications on file      Note:  This document was prepared using Dragon voice recognition software and may include unintentional dictation errors.   Loleta Rose, MD 05/17/2016 [phone]

## 2016-05-07 NOTE — Anesthesia Preprocedure Evaluation (Addendum)
Anesthesia Evaluation  Patient identified by MRN, date of birth, ID band Patient awake    Reviewed: Allergy & Precautions, H&P , NPO status , Patient's Chart, lab work & pertinent test results, reviewed documented beta blocker date and time   History of Anesthesia Complications Negative for: history of anesthetic complications  Airway Mallampati: II  TM Distance: >3 FB Neck ROM: full    Dental no notable dental hx.    Pulmonary neg pulmonary ROS,    Pulmonary exam normal breath sounds clear to auscultation       Cardiovascular Exercise Tolerance: Good hypertension, (-) angina+ CAD and + Past MI  (-) Cardiac Stents Normal cardiovascular exam(-) dysrhythmias (-) Valvular Problems/Murmurs Rhythm:regular Rate:Normal     Neuro/Psych negative neurological ROS  negative psych ROS   GI/Hepatic negative GI ROS, Neg liver ROS,   Endo/Other  negative endocrine ROS  Renal/GU negative Renal ROS  negative genitourinary   Musculoskeletal   Abdominal   Peds  Hematology negative hematology ROS (+)   Anesthesia Other Findings Past Medical History:   Hypertension                                                 History of rhabdomyolysis                                    Weakness                                                     Vitamin disease                                              Muscle weakness (generalized)                                Special sensory attacks (HCC)                                Urinary tract infection, site not specified                  Anemia, unspecified                                          Thiamin deficiency                                           Acute bronchitis                                             Epidemic vomiting syndrome  Change of dressing                                           Paroxysmal tachycardia, unspecified (HCC)                    Patient with urosepsis, not mentating appropriately, emergently taking to the OR for stent placement and Central venous access.  He is currently on a norepinephrine infusion going through a 18g peripheral.  Reproductive/Obstetrics negative OB ROS                            Anesthesia Physical Anesthesia Plan  ASA: IV and emergent  Anesthesia Plan: General, Rapid Sequence and Cricoid Pressure   Post-op Pain Management:    Induction:   Airway Management Planned:   Additional Equipment:   Intra-op Plan:   Post-operative Plan:   Informed Consent: I have reviewed the patients History and Physical, chart, labs and discussed the procedure including the risks, benefits and alternatives for the proposed anesthesia with the patient or authorized representative who has indicated his/her understanding and acceptance.   Dental Advisory Given  Plan Discussed with: Anesthesiologist, CRNA and Surgeon  Anesthesia Plan Comments:        Anesthesia Quick Evaluation

## 2016-05-07 NOTE — ED Notes (Signed)
Upon patient arrival to ED pt was noted to be pale, responsive only to pain with incomprehensible speech and mumbling when exposed to painful stimuli, weak and thready pulses, and diaphoretic. At this time pt remains cold to the touch, however appears to not be as pale and diaphoretic. Pt awakens with some verbal stimuli at this time and is able to answer questions and is alert and oriented to person, place, and time. Pt c/o pain with palpation to his abdomen, and also c/o pain to his leg when he tries to move his leg. Pt remains hypotensive at this time.

## 2016-05-07 NOTE — Consult Note (Signed)
Urology Consult  Referring physician: Dr. Karma Greaser  Reason for referral: sepsis, right ureteral calculus  Chief Complaint: abdominal pain  History of Present Illness: Nathan Figueroa is a 75yo with a hx of BPH and UTI who presented to [facility] with hypotension, altered mental status. He was recently admitted and treated for urosepsis. He had been well until yesterday when he developed right abdominal pain was associated nausea/vomiting. He has severe urgency, dysuria but no hematuria.   Past Medical History  Diagnosis Date  . Hypertension   . History of rhabdomyolysis   . Weakness   . Vitamin disease   . Muscle weakness (generalized)   . Special sensory attacks ([place])   . Urinary tract infection, site not specified   . Anemia, unspecified   . Thiamin deficiency   . Acute bronchitis   . Epidemic vomiting syndrome   . Change of dressing   . Paroxysmal tachycardia, unspecified ([place])    History reviewed. No pertinent past surgical history.  Medications: I have reviewed the patient's current medications. Allergies: No Known Allergies  History reviewed. No pertinent family history. Social History:  reports that he has never smoked. He does not have any smokeless tobacco history on file. His alcohol and drug histories are not on file.  Review of Systems  Constitutional: Positive for chills and malaise/fatigue.  Gastrointestinal: Positive for nausea and vomiting.  Genitourinary: Positive for dysuria, urgency, frequency, hematuria and flank pain.  Neurological: Positive for weakness.  All other systems reviewed and are negative.   Physical Exam:  Vital signs in last 24 hours: Temp:  [97.6 F (36.4 C)] 97.6 F (36.4 C) (06/18 1342) Pulse Rate:  [79-88] 81 (06/18 1525) Resp:  [22-31] 22 (06/18 1525) BP: (72-96)/(39-67) 85/67 mmHg (06/18 1525) SpO2:  [80 %-100 %] 100 % (06/18 1525) Weight:  [91.218 kg (201 lb 1.6 oz)] 91.218 kg (201 lb 1.6 oz) (06/18 1342) Physical Exam   Constitutional: He appears well-developed and well-nourished.  HENT:  Head: Normocephalic.  Eyes: EOM are normal. Pupils are equal, round, and reactive to light.  Neck: Normal range of motion. No thyromegaly present.  Cardiovascular: Regular rhythm.   Respiratory: Effort normal. No respiratory distress.  GI: Soft. He exhibits no distension and no mass. There is no tenderness. There is no rebound and no guarding.  Genitourinary: Penis normal.  Musculoskeletal: Normal range of motion.  Neurological: He is alert.  Skin: Skin is dry. There is pallor.  Psychiatric: He has a normal mood and affect. His behavior is normal. Judgment and thought content normal.    Laboratory Data:  Results for orders placed or performed during the hospital encounter of 04/23/2016 (from the past 72 hour(s))  Lactic acid, plasma     Status: Abnormal   Collection Time: 04/26/2016  1:49 PM  Result Value Ref Range   Lactic Acid, Venous 2.7 (HH) 0.5 - 2.0 mmol/L    Comment: CRITICAL RESULT CALLED TO, READ BACK BY AND VERIFIED WITH BRIAN QUIGLEY @ 1432 04/20/2016 BY [facility]   Comprehensive metabolic panel     Status: Abnormal   Collection Time: 04/28/2016  1:49 PM  Result Value Ref Range   Sodium 136 135 - 145 mmol/L   Potassium 6.8 (HH) 3.5 - 5.1 mmol/L    Comment: HEMOLYSIS AT THIS LEVEL MAY AFFECT RESULT CRITICAL RESULT CALLED TO, READ BACK BY AND VERIFIED WITH BRIAN QUIGLEY @ 1432 05/06/2016 BY [facility]    Chloride 101 101 - 111 mmol/L   CO2 22 22 - 32  mmol/L   Glucose, Bld 238 (H) 65 - 99 mg/dL   BUN 32 (H) 6 - 20 mg/dL   Creatinine, Ser 3.16 (H) 0.61 - 1.24 mg/dL   Calcium 9.0 8.9 - 10.3 mg/dL   Total Protein 9.4 (H) 6.5 - 8.1 g/dL   Albumin 3.6 3.5 - 5.0 g/dL   AST 33 15 - 41 U/L   ALT 16 (L) 17 - 63 U/L   Alkaline Phosphatase 118 38 - 126 U/L   Total Bilirubin 1.8 (H) 0.3 - 1.2 mg/dL   GFR calc non Af Amer 18 (L) >60 mL/min   GFR calc Af Amer 21 (L) >60 mL/min    Comment: (NOTE) The eGFR has been calculated  using the CKD EPI equation. This calculation has not been validated in all clinical situations. eGFR's persistently <60 mL/min signify possible Chronic Kidney Disease.    Anion gap 13 5 - 15  Lipase, blood     Status: None   Collection Time: 05/06/2016  1:49 PM  Result Value Ref Range   Lipase 20 11 - 51 U/L  Troponin I     Status: None   Collection Time: 05/14/2016  1:49 PM  Result Value Ref Range   Troponin I <0.03 <0.031 ng/mL    Comment:        NO INDICATION OF MYOCARDIAL INJURY.   CBC WITH DIFFERENTIAL     Status: Abnormal   Collection Time: 05/13/2016  1:49 PM  Result Value Ref Range   WBC 38.0 (H) 3.8 - 10.6 K/uL   RBC 4.68 4.40 - 5.90 MIL/uL   Hemoglobin 14.2 13.0 - 18.0 g/dL   HCT 42.8 40.0 - 52.0 %   MCV 91.5 80.0 - 100.0 fL   MCH 30.3 26.0 - 34.0 pg   MCHC 33.1 32.0 - 36.0 g/dL   RDW 16.3 (H) 11.5 - 14.5 %   Platelets 310 150 - 440 K/uL    Comment: COUNT MAY BE INACCURATE DUE TO FIBRIN CLUMPS.   Neutrophils Relative % 94% %   Neutro Abs 35.6 (H) 1.4 - 6.5 K/uL   Lymphocytes Relative 4% %   Lymphs Abs 1.4 1.0 - 3.6 K/uL   Monocytes Relative 2% %   Monocytes Absolute 0.9 0.2 - 1.0 K/uL   Eosinophils Relative 0% %   Eosinophils Absolute 0.0 0 - 0.7 K/uL   Basophils Relative 0% %   Basophils Absolute 0.0 0 - 0.1 K/uL  Blood gas, arterial ([facility], [facility], [facility])     Status: Abnormal (Preliminary result)   Collection Time: 04/20/2016  1:49 PM  Result Value Ref Range   FIO2 0.32    Delivery systems NASAL CANNULA    pH, Arterial 7.42 7.350 - 7.450   pCO2 arterial 30 (L) 32.0 - 48.0 mmHg   pO2, Arterial 80 (L) 83.0 - 108.0 mmHg   Bicarbonate 19.5 (L) 21.0 - 28.0 mEq/L   Acid-base deficit 3.9 (H) 0.0 - 2.0 mmol/L   O2 Saturation 96.0 %   Patient temperature 37.0    Collection site LEFT RADIAL    Sample type ARTERIAL DRAW    Allens test (pass/fail) PENDING PASS   Mechanical Rate PENDING   APTT     Status: None   Collection Time: 05/04/2016  1:49 PM  Result Value Ref Range    aPTT 28 24 - 36 seconds  Protime-INR     Status: None   Collection Time: 05/04/2016  1:49 PM  Result Value Ref Range   Prothrombin Time  14.5 11.4 - 15.0 seconds   INR 1.11   Urinalysis complete, with microscopic ([facility] only)     Status: Abnormal   Collection Time: 04/25/2016  2:20 PM  Result Value Ref Range   Color, Urine AMBER (A) YELLOW   APPearance TURBID (A) CLEAR   Glucose, UA NEGATIVE NEGATIVE mg/dL   Bilirubin Urine NEGATIVE NEGATIVE   Ketones, ur NEGATIVE NEGATIVE mg/dL   Specific Gravity, Urine 1.020 1.005 - 1.030   Hgb urine dipstick 1+ (A) NEGATIVE   pH 5.0 5.0 - 8.0   Protein, ur 100 (A) NEGATIVE mg/dL   Nitrite NEGATIVE NEGATIVE   Leukocytes, UA 3+ (A) NEGATIVE   RBC / HPF TOO NUMEROUS TO COUNT 0 - 5 RBC/hpf   WBC, UA TOO NUMEROUS TO COUNT 0 - 5 WBC/hpf   Bacteria, UA RARE (A) NONE SEEN   Squamous Epithelial / LPF 0-5 (A) NONE SEEN   WBC Clumps PRESENT    No results found for this or any previous visit (from the past 240 hour(s)). Creatinine:  Recent Labs  04/25/2016 1349  CREATININE 3.16*   Baseline Creatinine: unknown  Impression/Assessment:  74yo with right ureteral calculus, sepsis from a urinary source  Plan:  1. The patient will be taken emergently to the OR for right ureteral stent placement and foley placement 2. Patient will be admitted to the ICU post stent placement  Nicolette Bang 05/06/2016, 4:34 PM

## 2016-05-07 NOTE — Consult Note (Signed)
Pharmacy Antibiotic Note  Nathan NeighboursRalph L Figueroa is a 75 y.o. male admitted on 05/15/2016 with sepsis.  Pharmacy has been consulted for vancomycin/zosyn dosing.  Plan: Pt received 1g of vancomycin in the ED this evening. Will start 1g q 24hours 6 hours after initial dose for stacked dosing.  Vancomycin 1g IV every 24 hours.  Goal trough 15-20 mcg/mL. Zosyn 3.375g IV q8h (4 hour infusion). Continue to monitor renal function for needed dose adjustments. Trough prior to 5th total dose 6/21 at 2000.  Height: 6' (182.9 cm) Weight: 201 lb 1.6 oz (91.218 kg) IBW/kg (Calculated) : 77.6  Temp (24hrs), Avg:94.7 F (34.8 C), Min:91.8 F (33.2 C), Max:97.6 F (36.4 C)   Recent Labs Lab 05/09/2016 1349  WBC 38.0*  CREATININE 3.16*  LATICACIDVEN 2.7*    Estimated Creatinine Clearance: 22.9 mL/min (by C-G formula based on Cr of 3.16).    No Known Allergies  Antimicrobials this admission: vancomycin 6/18 >>  zosyn 6/18 >>   Dose adjustments this admission:   Microbiology results: 6/18 BCx:  6/18 UCx:    6/18 MRSA PCR:   Thank you for allowing pharmacy to be a part of this patient's care.  Olene FlossMelissa D Ossie Beltran, Pharm.D Clinical Pharmacist    04/21/2016 7:34 PM

## 2016-05-07 NOTE — Progress Notes (Addendum)
eLink Physician-Brief Progress Note Patient Name: Nathan NeighboursRalph L Berk DOB: 06/25/1941 MRN: [id number]   Date of Service  04/28/2016  HPI/Events of Note  Urosepsis s/p stent on levophed and vasopressin  With ARI in pt prev on ACEi and developing oliguria    eICU Interventions  Check cvp now and q 4h and push volume to get off pressors if possible      Intervention Category Major Interventions: Shock - evaluation and management  Sandrea HughsMichael Stan Cantave 05/16/2016, 10:23 PM

## 2016-05-07 NOTE — ED Notes (Signed)
Called code sepsis in to [email or web address] [facility]  [phone]

## 2016-05-07 NOTE — Op Note (Addendum)
.  Preoperative diagnosis: right proximal ureteral calculus, sepsis  Postoperative diagnosis: Same  Procedure: 1 cystoscopy 2. right retrograde pyelography 3.  Intraoperative fluoroscopy, under one hour, with interpretation 4. right 6 x 26 JJ stent placement  Attending: Wilkie AyePatrick Kamica Florance  Anesthesia: General  Estimated blood loss: None  Drains: Right 6 x 26 JJ ureteral stent without tether, 20 French foley catheter  Specimens: none  Antibiotics: Vanc and Zosyn  Findings: right proximal ureteral stone. Moderate hydronephrosis. No masses/lesions in the bladder. Ureteral orifices in normal anatomic location. Previous TURP  Indications: Patient is a 75 year old male with a history of right ureteral stone and concern for sepsis.  Procedure her in detail: The patient was brought to the operating room and a brief timeout was done to ensure correct patient, correct procedure, correct site.  General anesthesia was administered patient was placed in dorsal lithotomy position.  Their genitalia was then prepped and draped in usual sterile fashion.  A rigid 22 French cystoscope was passed in the urethra and the bladder.  Bladder was inspected free masses or lesions.  the ureteral orifices were in the normal orthotopic locations.  a 6 french ureteral catheter was then instilled into the right ureteral orifice.  a gentle retrograde was obtained and findings noted above.  we then placed a zip wire through the ureteral catheter and advanced up to the renal pelvis.    We then placed a 6 x 26 double-j ureteral stent over the original zip wire.  We then removed the wire and good coil was noted in the the renal pelvis under fluoroscopy and the bladder under direct vision.  A foley catheter was then placed. the bladder was then drained and this concluded the procedure which was well tolerated by patient.  Complications: None  Condition: Stable, remains intubated, transferred to PACU  Plan: Patient is to be  admitted for IV antibiotics. He will have his stone extraction in 2 weeks.

## 2016-05-07 NOTE — ED Notes (Signed)
This RN accompanied pt to CT scan. PT tolerated as well as can be expected.

## 2016-05-07 NOTE — Transfer of Care (Signed)
Immediate Anesthesia Transfer of Care Note  Patient: Rockie NeighboursRalph L Beazley  Procedure(s) Performed: Procedure(s): CYSTOSCOPY WITH RETROGRADE PYELOGRAM,  AND STENT PLACEMENT (Right)  Patient Location: ICU  Anesthesia Type:General  Level of Consciousness: sedated  Airway & Oxygen Therapy: Patient Spontanous Breathing and Patient remains intubated per anesthesia plan  Post-op Assessment: Report given to RN and Post -op Vital signs reviewed and stable  Post vital signs: Reviewed and stable  Last Vitals:  Filed Vitals:   2016/05/25 1823 2016/05/25 1831  BP: 79/64 79/64  Pulse: 64 68  Temp: 33.2 C   Resp: 9     Last Pain: There were no vitals filed for this visit.       Complications: No apparent anesthesia complications

## 2016-05-07 NOTE — H&P (Signed)
PULMONARY / CRITICAL CARE MEDICINE   Name: MAURI TOLEN MRN: [id number] DOB: Jul 07, 1941    ADMISSION DATE:  04/27/2016   REFERRING MD:  ED  CHIEF COMPLAINT:  AMS, hypotension and abdominal pain  HISTORY OF PRESENT ILLNESS:   This is a 75 yo caucasian male with a PMH of frequent UTIs, BPH, hypertension, anemia, bronchitis and paroxysmal tachycardia who presented to the ED with decreased level of consciousness, and abdominal pain. At the ED he was found to be hypotensive with MAPs in the low 50s. His UA was abnormal and a CT abdomen showed two adjacent stones lying in the mid to distal right ureter, largest measuring 6 mm, causing mild right hydroureteronephrosis. He was taken to the OR for a cystoscopy and a right ureteral stent placement. Postop, he remained hypotensive despite appetite, fluid resuscitation. He is currently intubated and sedated.  Patient was recently hospitalized from 04/15/2016 to 04/20/2016 and treated for urosepsis/UTI, non-ST elevation MI and AKI. He was discharged home on by mouth Ceftin  PAST MEDICAL HISTORY :  He  has a past medical history of Hypertension; History of rhabdomyolysis; Weakness; Vitamin disease; Muscle weakness (generalized); Special sensory attacks (HCC); Urinary tract infection, site not specified; Anemia, unspecified; Thiamin deficiency; Acute bronchitis; Epidemic vomiting syndrome; Change of dressing; and Paroxysmal tachycardia, unspecified (HCC).  PAST SURGICAL HISTORY: He  has no past surgical history on file.  No Known Allergies  No current facility-administered medications on file prior to encounter.   Current Outpatient Prescriptions on File Prior to Encounter  Medication Sig  . acetaminophen (TYLENOL) 650 MG CR tablet Take 650 mg by mouth 2 (two) times daily.  Marland Kitchen aspirin EC 81 MG EC tablet Take 1 tablet (81 mg total) by mouth daily.  . carvedilol (COREG) 12.5 MG tablet Take 1 tablet (12.5 mg total) by mouth 2 (two) times daily with a  meal.  . feeding supplement (BOOST / RESOURCE BREEZE) LIQD Take 1 Container by mouth 2 (two) times daily between meals.  . gabapentin (NEURONTIN) 100 MG capsule Take 200 mg by mouth 2 (two) times daily.  Marland Kitchen lisinopril (PRINIVIL,ZESTRIL) 5 MG tablet Take 1 tablet (5 mg total) by mouth daily.  . Multiple Vitamin (MULTIVITAMIN) tablet Take 1 tablet by mouth daily.  Marland Kitchen nystatin (MYCOSTATIN) 100000 UNIT/ML suspension Take 5 mLs (500,000 Units total) by mouth 4 (four) times daily.  . traMADol (ULTRAM) 50 MG tablet Take 50 mg by mouth 3 (three) times daily as needed for moderate pain or severe pain.  . cefUROXime (CEFTIN) 500 MG tablet Take 1 tablet (500 mg total) by mouth 2 (two) times daily with a meal.  . ciprofloxacin (CIPRO) 250 MG tablet Take 1 tablet (250 mg total) by mouth 2 (two) times daily.  Marland Kitchen diltiazem (CARDIZEM CD) 120 MG 24 hr capsule Take 1 capsule (120 mg total) by mouth daily.    FAMILY HISTORY:  His has no family status information on file.   SOCIAL HISTORY: He  reports that he has never smoked. He does not have any smokeless tobacco history on file.  REVIEW OF SYSTEMS:   Unable to obtain as patient is currently sedated and intubated  SUBJECTIVE:   VITAL SIGNS: BP 79/64 mmHg  Pulse 68  Temp(Src) 91.8 F (33.2 C) (Axillary)  Resp 9  Ht 6' (1.829 m)  Wt 201 lb 1.6 oz (91.218 kg)  BMI 27.27 kg/m2  SpO2 97%  HEMODYNAMICS:    VENTILATOR SETTINGS: Vent Mode:  [-] PRVC FiO2 (%):  [60 %]  60 % Set Rate:  [15 bmp] 15 bmp Vt Set:  [500 mL] 500 mL PEEP:  [5 cmH20] 5 cmH20  INTAKE / OUTPUT:    PHYSICAL EXAMINATION: General: Acutely ill-looking  Neuro:  Somnolent but awakens to voice and touch, able to follow basic commands HEENT:  Normocephalic and atraumatic, PERRLA, oral mucosa pink, ET tube in place Cardiovascular: Rate and rhythm regular, S1, S2 audible, no murmur, regurg or gallop Lungs: Bilateral airflow, positive Rales and rhonchi in anterior  lung fields, no  wheezing Abdomen: Mildly distended, Soft, nontender, normal bowel sounds Musculoskeletal:  No visible joint deformities. Extremities: +2 pulses bilaterally, no edema Skin:  Warm and dry, no discolorations  LABS:  BMET  Recent Labs Lab 04/22/2016 1349  NA 136  K 6.8*  CL 101  CO2 22  BUN 32*  CREATININE 3.16*  GLUCOSE 238*    Electrolytes  Recent Labs Lab 05/12/2016 1349  CALCIUM 9.0    CBC  Recent Labs Lab 04/27/2016 1349  WBC 38.0*  HGB 14.2  HCT 42.8  PLT 310    Coag's  Recent Labs Lab 05/05/2016 1349  APTT 28  INR 1.11    Sepsis Markers  Recent Labs Lab 05/06/2016 1349 04/30/2016 1925  LATICACIDVEN 2.7* 2.1*    ABG  Recent Labs Lab 04/22/2016 1349 05/05/2016 1840  PHART 7.42 7.31*  PCO2ART 30* 36  PO2ART 80* 88    Liver Enzymes  Recent Labs Lab 04/26/2016 1349  AST 33  ALT 16*  ALKPHOS 118  BILITOT 1.8*  ALBUMIN 3.6    Cardiac Enzymes  Recent Labs Lab 05/06/2016 1349 05/01/2016 1925  TROPONINI <0.03 0.03    Glucose  Recent Labs Lab 05/12/2016 1954  GLUCAP 143*    Imaging Ct Abdomen Pelvis Wo Contrast  05/17/2016  CLINICAL DATA:  Pt very poor historian. Pt states that his belly hurts. Per note in pt chart "Pt presents to ED from [facility] with a c/o altered mental status and code sepsis. Per EMS pt was hospitalized for a week for pneumonia and was started on levaquin yesterday. Per EMS pt is normally alert and oriented, presents to ED confused and per EMS combative. Pt presents to ED with staff moaning and with incomprehensible speech. Pt noted to be pale on arrival." No IV contrast per GFR value, pt unable to drink PO contrast. EXAM: CT ABDOMEN AND PELVIS WITHOUT CONTRAST TECHNIQUE: Multidetector CT imaging of the abdomen and pelvis was performed following the standard protocol without IV contrast. COMPARISON:  None. FINDINGS: Lung bases: Right greater than left dependent linear and reticular type opacities likely due to  atelectasis, scarring or a combination. Heart is normal in size. Hepatobiliary: Fatty infiltration of the liver. Low-density homogeneous mass in the right lobe adjacent to the gallbladder measuring 2 cm consistent with a cyst. No other liver masses or lesions. Gallbladder is abnormal appearance. It has increased attenuation material throughout with evidence of wall thickening and adjacent inflammation. No bile duct dilation. Spleen, pancreas, adrenal glands:  Unremarkable. Kidneys, ureters, bladder: Mild right hydronephrosis with hydroureter extending to the right mid to distal ureter at the level of the pelvic ran. There are 2 adjacent stones in this location, larger measuring 6 mm. There are no additional stones below this. There are multiple nonobstructing intrarenal stones on the right. No left intrarenal stones. There are low-density renal masses. There is a 4.5 cm midpole mass in the right and a 2.6 cm midpole mass on the left, both consistent with cysts.  No left hydronephrosis. Normal left ureter. Bladder is unremarkable. There changes from previous prostate surgery. Lymph nodes:  No pathologically enlarged lymph nodes. Ascites:  None. Gastrointestinal: There is significant distention of the rectum with stool. There is some mild hazy inflammatory changes adjacent to the rectum and lower sigmoid colon. Scattered diverticula noted along the sigmoid colon. No evidence of diverticulitis. There is no evidence of bowel obstruction. There is no bowel wall thickening. Stomach is unremarkable. Normal appendix visualized. 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 Degenerative changes are noted along the visualized spine. No osteoblastic or osteolytic lesions. IMPRESSION: 1. Two adjacent stones lie in the mid to distal right ureter, largest measuring 6 mm, causing mild right hydroureteronephrosis. 2. Gallbladder also appears abnormal with increased attenuation material and evidence of wall thickening and  adjacent inflammatory change. Consider acute cholecystitis if this correlates clinically, which could be further assessed with limited right upper quadrant ultrasound. 3. Rectum is significantly distended with stool and there is mild hazy inflammatory change in the perirectal fat in the fat adjacent to the lower sigmoid colon. Consider proctitis if this patient has rectal/anal pain. 4. No other acute findings. 5. Hepatic steatosis. Hepatic cyst. Bilateral renal cysts. Nonobstructing stones in the right kidney. Sigmoid colon diverticula without diverticulitis. Arthropathic changes of the hips and degenerative changes of spine. Electronically Signed   By: Amie Portland M.D.   On: 04/21/2016 15:26   Dg Abd 1 View  04/26/2016  CLINICAL DATA:  Orogastric tube placement.  Initial encounter. EXAM: ABDOMEN - 1 VIEW COMPARISON:  CT of the abdomen and pelvis performed earlier today at 3:08 p.m. FINDINGS: The patient's enteric tube is seen ending overlying the body of the stomach, with the side port about the fundus of the stomach. The visualized bowel gas pattern is grossly unremarkable. Air-filled loops of small and large bowel are noted. The stomach is partially filled with air. No free intra-abdominal air is seen, though evaluation for free air is limited on a single supine view. Clips are noted within the right upper quadrant, reflecting prior cholecystectomy. No acute osseous abnormalities are identified. IMPRESSION: 1. Enteric tube noted ending overlying the body of the stomach, with the side port about the fundus of the stomach. 2. Air-filled loops of small and large bowel may reflect mild dysmotility. Electronically Signed   By: Roanna Raider M.D.   On: 05/13/2016 20:12   Dg Chest Port 1 View  05/06/2016  CLINICAL DATA:  Acute onset of hypotension and altered mental status. Urosepsis. Initial encounter. EXAM: PORTABLE CHEST 1 VIEW COMPARISON:  Chest radiograph performed earlier today at 2:09 p.m. FINDINGS: The  patient's endotracheal tube is seen ending 5 cm above the carina. A left IJ line is noted ending about the mid SVC. Small bilateral pleural effusions are suggested. Mild bibasilar opacities may reflect pneumonia, given the patient's symptoms. No pneumothorax is seen. The cardiomediastinal silhouette is mildly enlarged. No acute osseous abnormalities are identified. IMPRESSION: 1. Endotracheal tube seen ending 5 cm above the carina. 2. Small bilateral pleural effusions suggested. Mild bibasilar opacities may reflect pneumonia, given the patient's symptoms. 3. Mild cardiomegaly. Electronically Signed   By: Roanna Raider M.D.   On: 05/11/2016 19:19   Dg Chest Port 1 View  04/20/2016  CLINICAL DATA:  Sepsis. EXAM: PORTABLE CHEST 1 VIEW COMPARISON:  Radiograph of Apr 17, 2016. FINDINGS: Stable cardiomediastinal silhouette. Right hilar clips are noted. No pneumothorax or pleural effusion is noted. No acute pulmonary disease is noted. Bony thorax  is unremarkable. IMPRESSION: No acute cardiopulmonary abnormality seen. Electronically Signed   By: Lupita RaiderJames  Green Jr, M.D.   On: 04/21/2016 14:29    STUDIES:  Last 2-D echo was 04/16/2016 and showed an EF of 35%  CULTURES: Blood cultures 2. Urine culture MRSA screen positive  ANTIBIOTICS: Vancomycin 05/06/2016. Zosyn 05/08/2016  SIGNIFICANT EVENTS: 05/12/2016: ED with altered mental status, hypotension, and abdominal pain, taken to the OR for a right ureteral stent placement  LINES/TUBES: Left IJ 05/03/2016 ET tube 04/25/2016  DISCUSSION: 75 year old male presenting with right renal calculus, UTI, and sepsis; now status post right ureteral stent placement and in septic shock  ASSESSMENT / PLAN:  PULMONARY A: Vent dependent respiratory failure due to sepsis P:   -Full vent support with current settings-FiO2 60%, PEEP of 5, rate of 16 and tidal volume of 500. -Daily chest x-ray. -ABG reviewed-no vent changes. -Daily weaning trials as  tolerated  CARDIOVASCULAR A:  Septic shock-now on 3 pressors History of hypertension P:  -Monitor hemodynamics per ICU protocol -Vasopressin, Neo-Synephrine, and phenylephrine infusions; titrate to maintain mean arterial blood pressure greater than 65 -Continue IV fluid boluses for CVP readings less than 12  RENAL A:   Acute renal failure secondary to obstructive uropathy/Rt ureteral calculus Hyperkalemia-resolved  P:   -IV fluids -Foley K per protocol -Monitor I's and O's. -Renally dose medications. -Avoid nephrotoxic medications  GASTROINTESTINAL A:   Abdominal distention P:   -NG to intermittent suction -Pepcid for stress ulcer prophylaxis  HEMATOLOGIC A:   Anemia-blood loss versus chronic disease P:  -Monitor CBC and transfuse when necessary  INFECTIOUS A:   Sepsis of urinary source Severe leukocytosis P:   -Follow-up cultures -Broad-spectrum antibiotics as above  ENDOCRINE A:   Steroid-induced hyperglycemia without diabetes P:   -Point-of-care blood glucose monitoring every 4 hours with Sliding-scale insulin coverage  NEUROLOGIC A:   Acute metabolic and encephalopathy secondary to sepsis Vent associated discomfort P:   RASS goal: -1 to -2 -Fentanyl and Versed for vent sedation and comfort -Wake assessment every morning   Disposition and Family update: No family at bedside. Will update once available.   Best Practice: Code Status:  Full. Diet: NPO GI prophylaxis: H2 blocker. VTE prophylaxis:  SCD's / heparin.  [facility] ANP-BC Pulmonary and Critical Care Medicine [facility] Pager [phone] or [phone]  04/29/2016, 8:50 PM  STAFF NOTE: I, Dr. Lucie LeatherKurian David Estella Malatesta,  have personally reviewed patient's available data, including medical history, events of note, physical examination and test results as part of my evaluation. I have discussed with NP and other care providers such as pharmacist, RN and RRT.  In addition,  I  personally evaluated patient and elicited key findings     The Rest per NP whose note is outlined above and that I agree with     The Patient requires high complexity decision making for assessment and support, frequent evaluation and titration of therapies, application of advanced monitoring technologies and extensive interpretation of multiple databases. This Critical care time does not reflrect procedure time or supervisory time of NP but could involve care discussion time Overall, patient is critically ill, prognosis is guarded.  Patient with Multiorgan failure and at high risk for cardiac arrest and death.    Lucie LeatherKurian David Isabell Bonafede, M.D.  [facility]  Medical Director [facility] Medical Director [facility] Cardio-Pulmonary Department

## 2016-05-07 NOTE — ED Notes (Signed)
Pt presents to ED from [facility] with a c/o altered mental status and code sepsis. Per EMS pt was hospitalized for a week for pneumonia and was started on levaquin yesterday. Per EMS pt is normally alert and oriented, presents to ED confused and per EMS combative. Pt presents to ED with staff moaning and with incomprehensible speech. Pt noted to be pale on arrival.

## 2016-05-08 ENCOUNTER — Inpatient Hospital Stay: Payer: Medicare Other

## 2016-05-08 ENCOUNTER — Encounter: Payer: Self-pay | Admitting: Urology

## 2016-05-08 DIAGNOSIS — A419 Sepsis, unspecified organism: Principal | ICD-10-CM

## 2016-05-08 DIAGNOSIS — N201 Calculus of ureter: Secondary | ICD-10-CM

## 2016-05-08 DIAGNOSIS — J9600 Acute respiratory failure, unspecified whether with hypoxia or hypercapnia: Secondary | ICD-10-CM

## 2016-05-08 DIAGNOSIS — R6520 Severe sepsis without septic shock: Secondary | ICD-10-CM

## 2016-05-08 DIAGNOSIS — R6521 Severe sepsis with septic shock: Secondary | ICD-10-CM

## 2016-05-08 DIAGNOSIS — R69 Illness, unspecified: Secondary | ICD-10-CM

## 2016-05-08 DIAGNOSIS — E872 Acidosis: Secondary | ICD-10-CM

## 2016-05-08 LAB — COMPREHENSIVE METABOLIC PANEL
ALK PHOS: 80 U/L (ref 38–126)
ALT: 12 U/L — AB (ref 17–63)
AST: 26 U/L (ref 15–41)
Albumin: 2.3 g/dL — ABNORMAL LOW (ref 3.5–5.0)
Anion gap: 8 (ref 5–15)
BUN: 34 mg/dL — AB (ref 6–20)
CALCIUM: 6.7 mg/dL — AB (ref 8.9–10.3)
CO2: 17 mmol/L — ABNORMAL LOW (ref 22–32)
CREATININE: 2.59 mg/dL — AB (ref 0.61–1.24)
Chloride: 113 mmol/L — ABNORMAL HIGH (ref 101–111)
GFR calc non Af Amer: 23 mL/min — ABNORMAL LOW (ref >60)
GFR, EST AFRICAN AMERICAN: 27 mL/min — AB (ref >60)
Glucose, Bld: 274 mg/dL — ABNORMAL HIGH (ref 65–99)
Potassium: 5.8 mmol/L — ABNORMAL HIGH (ref 3.5–5.1)
SODIUM: 138 mmol/L (ref 135–145)
Total Bilirubin: 1.3 mg/dL — ABNORMAL HIGH (ref 0.3–1.2)
Total Protein: 6.2 g/dL — ABNORMAL LOW (ref 6.5–8.1)

## 2016-05-08 LAB — C DIFFICILE QUICK SCREEN W PCR REFLEX
C DIFFICILE (CDIFF) TOXIN: NEGATIVE
C DIFFICLE (CDIFF) ANTIGEN: NEGATIVE
C Diff interpretation: NEGATIVE

## 2016-05-08 LAB — BLOOD GAS, ARTERIAL
ACID-BASE DEFICIT: 3.9 mmol/L — AB (ref 0.0–2.0)
ACID-BASE DEFICIT: 10 mmol/L — AB (ref 0.0–2.0)
ALLENS TEST (PASS/FAIL): POSITIVE — AB
Bicarbonate: 19.5 mEq/L — ABNORMAL LOW (ref 21.0–28.0)
Bicarbonate: 16.7 mEq/L — ABNORMAL LOW (ref 21.0–28.0)
FIO2: 0.32
FIO2: 0.6
MECHVT: 500 mL
Mechanical Rate: 15
O2 SAT: 97.8 %
O2 Saturation: 96 %
PATIENT TEMPERATURE: 37
PCO2 ART: 30 mmHg — AB (ref 32.0–48.0)
PCO2 ART: 39 mmHg (ref 32.0–48.0)
PEEP: 5 cmH2O
PH ART: 7.42 (ref 7.350–7.450)
PH ART: 7.24 — AB (ref 7.350–7.450)
PO2 ART: 80 mmHg — AB (ref 83.0–108.0)
PO2 ART: 117 mmHg — AB (ref 83.0–108.0)
Patient temperature: 37

## 2016-05-08 LAB — PHOSPHORUS: PHOSPHORUS: 4.1 mg/dL (ref 2.5–4.6)

## 2016-05-08 LAB — TROPONIN I: TROPONIN I: 0.03 ng/mL (ref <0.031)

## 2016-05-08 LAB — CBC
HCT: 31.7 % — ABNORMAL LOW (ref 40.0–52.0)
HEMATOCRIT: 33.2 % — AB (ref 40.0–52.0)
HEMOGLOBIN: 10.2 g/dL — AB (ref 13.0–18.0)
Hemoglobin: 10 g/dL — ABNORMAL LOW (ref 13.0–18.0)
MCH: 29.2 pg (ref 26.0–34.0)
MCH: 28.9 pg (ref 26.0–34.0)
MCHC: 31.7 g/dL — AB (ref 32.0–36.0)
MCHC: 30.5 g/dL — ABNORMAL LOW (ref 32.0–36.0)
MCV: 92.4 fL (ref 80.0–100.0)
MCV: 94.6 fL (ref 80.0–100.0)
Platelets: 254 10*3/uL (ref 150–440)
Platelets: 268 10*3/uL (ref 150–440)
RBC: 3.43 MIL/uL — ABNORMAL LOW (ref 4.40–5.90)
RBC: 3.52 MIL/uL — AB (ref 4.40–5.90)
RDW: 16 % — AB (ref 11.5–14.5)
RDW: 16.2 % — ABNORMAL HIGH (ref 11.5–14.5)
WBC: 75.5 10*3/uL (ref 3.8–10.6)
WBC: 68.4 10*3/uL — AB (ref 3.8–10.6)

## 2016-05-08 LAB — PROTIME-INR
INR: 1.31
Prothrombin Time: 16.4 seconds — ABNORMAL HIGH (ref 11.4–15.0)

## 2016-05-08 LAB — URINE CULTURE: Culture: NO GROWTH

## 2016-05-08 LAB — GLUCOSE, CAPILLARY
GLUCOSE-CAPILLARY: 155 mg/dL — AB (ref 65–99)
GLUCOSE-CAPILLARY: 225 mg/dL — AB (ref 65–99)
Glucose-Capillary: 113 mg/dL — ABNORMAL HIGH (ref 65–99)
Glucose-Capillary: 239 mg/dL — ABNORMAL HIGH (ref 65–99)
Glucose-Capillary: 243 mg/dL — ABNORMAL HIGH (ref 65–99)
Glucose-Capillary: 193 mg/dL — ABNORMAL HIGH (ref 65–99)
Glucose-Capillary: 167 mg/dL — ABNORMAL HIGH (ref 65–99)

## 2016-05-08 LAB — PROCALCITONIN: Procalcitonin: 30.1 ng/mL

## 2016-05-08 LAB — LACTIC ACID, PLASMA: Lactic Acid, Venous: 1.9 mmol/L (ref 0.5–2.0)

## 2016-05-08 LAB — MAGNESIUM: Magnesium: 2.3 mg/dL (ref 1.7–2.4)

## 2016-05-08 MED ORDER — HYDROCORTISONE NA SUCCINATE PF 100 MG IJ SOLR
50.0000 mg | Freq: Four times a day (QID) | INTRAMUSCULAR | Status: DC
  Administered 2016-05-08 – 2016-05-11 (×13): 50 mg via INTRAVENOUS
  Filled 2016-05-08 (×13): qty 2

## 2016-05-08 MED ORDER — VITAL HIGH PROTEIN PO LIQD
1000.0000 mL | ORAL | Status: DC
  Administered 2016-05-08 (×4)
  Administered 2016-05-08: 1000 mL
  Administered 2016-05-08 – 2016-05-09 (×10)

## 2016-05-08 MED ORDER — INSULIN ASPART 100 UNIT/ML ~~LOC~~ SOLN
0.0000 [IU] | SUBCUTANEOUS | Status: DC
  Administered 2016-05-08 (×2): 4 [IU] via SUBCUTANEOUS
  Administered 2016-05-08: 7 [IU] via SUBCUTANEOUS
  Administered 2016-05-08: 4 [IU] via SUBCUTANEOUS
  Administered 2016-05-09 – 2016-05-13 (×10): 3 [IU] via SUBCUTANEOUS
  Filled 2016-05-08: qty 4
  Filled 2016-05-08: qty 7
  Filled 2016-05-08 (×4): qty 3
  Filled 2016-05-08: qty 4
  Filled 2016-05-08: qty 3
  Filled 2016-05-08: qty 4
  Filled 2016-05-08 (×5): qty 3
  Filled 2016-05-08: qty 4

## 2016-05-08 MED ORDER — SODIUM POLYSTYRENE SULFONATE 15 GM/60ML PO SUSP
30.0000 g | Freq: Once | ORAL | Status: AC
  Administered 2016-05-08: 30 g
  Filled 2016-05-08: qty 120

## 2016-05-08 NOTE — Progress Notes (Signed)
Urology Consult Follow Up  Subjective: Status post right ureteral stent placement yesterday afternoon by Dr. Noah Delaine. Patient remains in the ICU, critically ill on pressors x 2, intubated and sedated.  Marked leukocytosis, afebrile. Currently on vancomycin, Zosyn.   Anti-infectives: Anti-infectives    Start     Dose/Rate Route Frequency Ordered Stop   04/25/2016 2030  piperacillin-tazobactam (ZOSYN) IVPB 3.375 g     3.375 g 12.5 mL/hr over 240 Minutes Intravenous Every 8 hours 05/02/2016 1933     04/30/2016 2030  vancomycin (VANCOCIN) IVPB 1000 mg/200 mL premix     1,000 mg 200 mL/hr over 60 Minutes Intravenous Every 24 hours 05/06/2016 1933     05/06/2016 1400  piperacillin-tazobactam (ZOSYN) IVPB 3.375 g     3.375 g 100 mL/hr over 30 Minutes Intravenous  Once 04/29/2016 1349 05/17/2016 1457   04/28/2016 1400  vancomycin (VANCOCIN) IVPB 1000 mg/200 mL premix     1,000 mg 200 mL/hr over 60 Minutes Intravenous  Once 04/20/2016 1349 04/21/2016 1710      Current Facility-Administered Medications  Medication Dose Route Frequency Provider Last Rate Last Dose  . 0.9 %  sodium chloride infusion  250 mL Intravenous PRN Wilhelmina Mcardle, MD      . 0.9 %  sodium chloride infusion   Intravenous Continuous Wilhelmina Mcardle, MD 75 mL/hr at 05/08/16 1611    . acetaminophen (TYLENOL) tablet 650 mg  650 mg Per Tube Q4H PRN Wilhelmina Mcardle, MD      . antiseptic oral rinse solution (CORINZ)  7 mL Mouth Rinse 10 times per day Mikael Spray, NP   7 mL at 05/08/16 1423  . bisacodyl (DULCOLAX) suppository 10 mg  10 mg Rectal Daily PRN Mikael Spray, NP      . chlorhexidine gluconate (SAGE KIT) (PERIDEX) 0.12 % solution 15 mL  15 mL Mouth Rinse BID Mikael Spray, NP   15 mL at 05/08/16 0830  . famotidine (PEPCID) IVPB 20 mg premix  20 mg Intravenous Q24H Wilhelmina Mcardle, MD   20 mg at 05/06/2016 2043  . feeding supplement (PRO-STAT SUGAR FREE 64) liquid 30 mL  30 mL Per Tube BID Wilhelmina Mcardle, MD   30 mL at  05/08/16 1421  . feeding supplement (VITAL HIGH PROTEIN) liquid 1,000 mL  1,000 mL Per Tube Q24H Flora Lipps, MD      . fentaNYL (SUBLIMAZE) bolus via infusion 25 mcg  25 mcg Intravenous Q1H PRN Mikael Spray, NP      . fentaNYL (SUBLIMAZE) injection 50 mcg  50 mcg Intravenous Q15 min PRN Wilhelmina Mcardle, MD      . fentaNYL (SUBLIMAZE) injection 50 mcg  50 mcg Intravenous Q2H PRN Wilhelmina Mcardle, MD      . fentaNYL 2528mg in NS 2572m(1011mml) infusion-PREMIX  25-400 mcg/hr Intravenous Continuous MagMikael SprayP 7.5 mL/hr at 05/08/16 1145 75 mcg/hr at 05/08/16 1145  . heparin injection 5,000 Units  5,000 Units Subcutaneous Q8H DavWilhelmina McardleD   5,000 Units at 05/08/16 1351  . hydrocortisone sodium succinate (SOLU-CORTEF) 100 MG injection 50 mg  50 mg Intravenous Q6H Magadalene S TAbran DukeP   50 mg at 05/08/16 1351  . insulin aspart (novoLOG) injection 0-20 Units  0-20 Units Subcutaneous Q4H KurFlora LippsD   7 Units at 05/08/16 1356  . midazolam (VERSED) injection 1 mg  1 mg Intravenous Q15 min PRN MagMikael SprayP      .  midazolam (VERSED) injection 1 mg  1 mg Intravenous Q2H PRN Mikael Spray, NP      . norepinephrine (LEVOPHED) 16 mg in dextrose 5 % 250 mL (0.064 mg/mL) infusion  0-75 mcg/min Intravenous Titrated Mikael Spray, NP 70.3 mL/hr at 05/08/16 1610 75 mcg/min at 05/08/16 1610  . phenylephrine (NEO-SYNEPHRINE) 10 mg in dextrose 5 % 250 mL (0.04 mg/mL) infusion  30-200 mcg/min Intravenous Continuous Mikael Spray, NP   Stopped at 05/08/16 1415  . piperacillin-tazobactam (ZOSYN) IVPB 3.375 g  3.375 g Intravenous Q8H Wilhelmina Mcardle, MD   3.375 g at 05/08/16 1352  . sennosides (SENOKOT) 8.8 MG/5ML syrup 5 mL  5 mL Per Tube BID PRN Mikael Spray, NP      . sodium chloride 0.9 % bolus 500 mL  500 mL Intravenous PRN Wilhelmina Mcardle, MD      . vancomycin (VANCOCIN) IVPB 1000 mg/200 mL premix  1,000 mg Intravenous Q24H Wilhelmina Mcardle, MD   1,000 mg at  04/25/2016 2000  . vasopressin (PITRESSIN) 40 Units in sodium chloride 0.9 % 250 mL (0.16 Units/mL) infusion  0.03 Units/min Intravenous Continuous Martha Clan, MD 11.3 mL/hr at 05/08/16 1610 0.03 Units/min at 05/08/16 1610     Objective: Vital signs in last 24 hours: Temp:  [91.8 F (33.2 C)-99.3 F (37.4 C)] 98.3 F (36.8 C) (06/19 1200) Pulse Rate:  [53-89] 74 (06/19 1400) Resp:  [7-22] 15 (06/19 1400) BP: (70-128)/(36-89) 105/36 mmHg (06/19 1400) SpO2:  [88 %-100 %] 99 % (06/19 1400) Arterial Line BP: (67-130)/(41-71) 108/61 mmHg (06/19 1400) FiO2 (%):  [0.6 %-60 %] 40 % (06/19 1620) Weight:  [210 lb 12.2 oz (95.6 kg)-211 lb 6.7 oz (95.9 kg)] 211 lb 6.7 oz (95.9 kg) (06/19 0420)  Intake/Output from previous day: 06/18 0701 - 06/19 0700 In: 6625.7 [I.V.:2725.7; IV Piggyback:3900] Out: 1137 [Urine:785; Emesis/NG output:350; Stool:2] Intake/Output this shift: Total I/O In: 2123.2 [I.V.:1853.2; NG/GT:220; IV Piggyback:50] Out: 829 [Urine:775]   Physical Exam  Intubed / sedated Abd soft, rectal tube in place Foley with clear yellow urine  Lab Results:   Recent Labs  05/08/16 0102 05/08/16 0657  WBC 75.5* 68.4*  HGB 10.0* 10.2*  HCT 31.7* 33.2*  PLT 254 268   BMET  Recent Labs  05/04/2016 1925 05/08/16 0520  NA 139 138  K 4.9 5.8*  CL 111 113*  CO2 19* 17*  GLUCOSE 183* 274*  BUN 35* 34*  CREATININE 2.99* 2.59*  CALCIUM 7.2* 6.7*   PT/INR  Recent Labs  04/27/2016 1349 05/08/16 0520  LABPROT 14.5 16.4*  INR 1.11 1.31   ABG  Recent Labs  05/12/2016 1840 05/08/16 0627  PHART 7.31* 7.24*  HCO3 18.1* 16.7*    Studies/Results: Ct Abdomen Pelvis Wo Contrast  05/06/2016  CLINICAL DATA:  Pt very poor historian. Pt states that his belly hurts. Per note in pt chart "Pt presents to ED from [facility] with a c/o altered mental status and code sepsis. Per EMS pt was hospitalized for a week for pneumonia and was started on levaquin yesterday. Per EMS pt  is normally alert and oriented, presents to ED confused and per EMS combative. Pt presents to ED with staff moaning and with incomprehensible speech. Pt noted to be pale on arrival." No IV contrast per GFR value, pt unable to drink PO contrast. EXAM: CT ABDOMEN AND PELVIS WITHOUT CONTRAST TECHNIQUE: Multidetector CT imaging of the abdomen and pelvis was performed following the standard protocol without  IV contrast. COMPARISON:  None. FINDINGS: Lung bases: Right greater than left dependent linear and reticular type opacities likely due to atelectasis, scarring or a combination. Heart is normal in size. Hepatobiliary: Fatty infiltration of the liver. Low-density homogeneous mass in the right lobe adjacent to the gallbladder measuring 2 cm consistent with a cyst. No other liver masses or lesions. Gallbladder is abnormal appearance. It has increased attenuation material throughout with evidence of wall thickening and adjacent inflammation. No bile duct dilation. Spleen, pancreas, adrenal glands:  Unremarkable. Kidneys, ureters, bladder: Mild right hydronephrosis with hydroureter extending to the right mid to distal ureter at the level of the pelvic ran. There are 2 adjacent stones in this location, larger measuring 6 mm. There are no additional stones below this. There are multiple nonobstructing intrarenal stones on the right. No left intrarenal stones. There are low-density renal masses. There is a 4.5 cm midpole mass in the right and a 2.6 cm midpole mass on the left, both consistent with cysts. No left hydronephrosis. Normal left ureter. Bladder is unremarkable. There changes from previous prostate surgery. Lymph nodes:  No pathologically enlarged lymph nodes. Ascites:  None. Gastrointestinal: There is significant distention of the rectum with stool. There is some mild hazy inflammatory changes adjacent to the rectum and lower sigmoid colon. Scattered diverticula noted along the sigmoid colon. No evidence of  diverticulitis. There is no evidence of bowel obstruction. There is no bowel wall thickening. Stomach is unremarkable. Normal appendix visualized. 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 Degenerative changes are noted along the visualized spine. No osteoblastic or osteolytic lesions. IMPRESSION: 1. Two adjacent stones lie in the mid to distal right ureter, largest measuring 6 mm, causing mild right hydroureteronephrosis. 2. Gallbladder also appears abnormal with increased attenuation material and evidence of wall thickening and adjacent inflammatory change. Consider acute cholecystitis if this correlates clinically, which could be further assessed with limited right upper quadrant ultrasound. 3. Rectum is significantly distended with stool and there is mild hazy inflammatory change in the perirectal fat in the fat adjacent to the lower sigmoid colon. Consider proctitis if this patient has rectal/anal pain. 4. No other acute findings. 5. Hepatic steatosis. Hepatic cyst. Bilateral renal cysts. Nonobstructing stones in the right kidney. Sigmoid colon diverticula without diverticulitis. Arthropathic changes of the hips and degenerative changes of spine. Electronically Signed   By: Lajean Manes M.D.   On: 05/06/2016 15:26   Dg Abd 1 View  05/11/2016  CLINICAL DATA:  Orogastric tube placement.  Initial encounter. EXAM: ABDOMEN - 1 VIEW COMPARISON:  CT of the abdomen and pelvis performed earlier today at 3:08 p.m. FINDINGS: The patient's enteric tube is seen ending overlying the body of the stomach, with the side port about the fundus of the stomach. The visualized bowel gas pattern is grossly unremarkable. Air-filled loops of small and large bowel are noted. The stomach is partially filled with air. No free intra-abdominal air is seen, though evaluation for free air is limited on a single supine view. Clips are noted within the right upper quadrant, reflecting prior cholecystectomy. No acute osseous  abnormalities are identified. IMPRESSION: 1. Enteric tube noted ending overlying the body of the stomach, with the side port about the fundus of the stomach. 2. Air-filled loops of small and large bowel may reflect mild dysmotility. Electronically Signed   By: Garald Balding M.D.   On: 05/04/2016 20:12   Dg Chest Port 1 View  05/08/2016  CLINICAL DATA:  Respiratory failure. EXAM: PORTABLE  CHEST 1 VIEW COMPARISON:  04/24/2016. FINDINGS: Interval placement of NG tube, its tip is below left hemidiaphragm P Endotracheal tube, left IJ line in stable position. Stable cardiomegaly. Mild bibasilar subsegmental atelectasis and/or infiltrates. Small bilateral pleural effusions. Postsurgical changes right lung. No pneumothorax . IMPRESSION: 1. Interval placement of NG tube, its tip is below left hemidiaphragm. Endotracheal tube and left IJ line stable position. 2. Low lung volumes with mild bibasilar atelectasis and/or infiltrates again noted. Small bilateral pleural effusions. 3. Postsurgical changes right lung. Electronically Signed   By: Marcello Moores  Register   On: 05/08/2016 07:19   Dg Chest Port 1 View  05/04/2016  CLINICAL DATA:  Acute onset of hypotension and altered mental status. Urosepsis. Initial encounter. EXAM: PORTABLE CHEST 1 VIEW COMPARISON:  Chest radiograph performed earlier today at 2:09 p.m. FINDINGS: The patient's endotracheal tube is seen ending 5 cm above the carina. A left IJ line is noted ending about the mid SVC. Small bilateral pleural effusions are suggested. Mild bibasilar opacities may reflect pneumonia, given the patient's symptoms. No pneumothorax is seen. The cardiomediastinal silhouette is mildly enlarged. No acute osseous abnormalities are identified. IMPRESSION: 1. Endotracheal tube seen ending 5 cm above the carina. 2. Small bilateral pleural effusions suggested. Mild bibasilar opacities may reflect pneumonia, given the patient's symptoms. 3. Mild cardiomegaly. Electronically Signed   By:  Garald Balding M.D.   On: 05/03/2016 19:19   Dg Chest Port 1 View  04/20/2016  CLINICAL DATA:  Sepsis. EXAM: PORTABLE CHEST 1 VIEW COMPARISON:  Radiograph of Apr 17, 2016. FINDINGS: Stable cardiomediastinal silhouette. Right hilar clips are noted. No pneumothorax or pleural effusion is noted. No acute pulmonary disease is noted. Bony thorax is unremarkable. IMPRESSION: No acute cardiopulmonary abnormality seen. Electronically Signed   By: Marijo Conception, M.D.   On: 05/15/2016 14:29     Assessment: 75 year old male post-up day 1 status post right ureteral stent placement for obstructing ureteral stone complicated  by profound sepsis.  Remains in ICU, on pressors x 2.  Marked leukocytosis, c diff pendin.  AKI with slowly improving Cr.    Plan: -ICU care per primary team/ supportive care -Agree with IV abx, previous urine/ blood culture reviewed and previously sensitive to above -f/u UCx/ blood cx/ stool PCR -Urology to follow peripherally and will be involved closer to discharge for dispo planning  Please contact in the interim with any questions or concern.    LOS: 1 day    Hollice Espy 05/08/2016

## 2016-05-08 NOTE — Progress Notes (Signed)
Initial Nutrition Assessment  DOCUMENTATION CODES:   Not applicable  INTERVENTION:  -TF: discussed nutritional poc during ICU rounds with MD Kasa; received verbal order for trophic feedings, Vital High Protein orders modified to reflect this. Asher MuirJamie RN aware. Assess tolerance and ability to titrate to goal rate on follow. Pt may benefit from promotility agent, continue to assess   NUTRITION DIAGNOSIS:   Inadequate oral intake related to acute illness as evidenced by NPO status.  GOAL:   Provide needs based on ASPEN/SCCM guidelines   MONITOR:   TF tolerance, Vent status, Labs, Weight trends, I & O's  REASON FOR ASSESSMENT:   Ventilator, Consult Enteral/tube feeding initiation and management  ASSESSMENT:   75 yo male admitted with abdominal pain, septic shock with UTI, vent dependent respiratory failure. Pt with hx of cognitive communication deficit, bed bound at baseline  Pt currently on vent; on 3 pressors; CT abdomen with 2 kidney stones, pt to OR for cystoscopy and right uretral stent placement yesterday.  Adult Tube Feeding Protocol ordered yesterday but TF never started; TF order remains active this AM but not infusing. OG tube with tip in stomach per abdominal xray, xray also showing possible mild dysmotility; OG with 350 mL dark output, +multiple BMs, BS present, abdomen distended  Past Medical History  Diagnosis Date  . Hypertension   . History of rhabdomyolysis   . Weakness   . Vitamin disease   . Muscle weakness (generalized)   . Special sensory attacks (HCC)   . Urinary tract infection, site not specified   . Anemia, unspecified   . Thiamin deficiency   . Acute bronchitis   . Epidemic vomiting syndrome   . Change of dressing   . Paroxysmal tachycardia, unspecified (HCC)     Diet Order:   NPO  Skin:  Reviewed, no issues  Last BM:  05/08/16 multiple loose stools, C.diff pending   Labs: potassium 5.8 (kayexalate ordered), corrected calcium 8.1 (albumin  2.3)  Glucose Profile:   Recent Labs  05/08/16 0407 05/08/16 0749 05/08/16 1148  GLUCAP 239* 225* 243*     Meds: NS at 75 ml/hr, ss novolog, solumedrol, levophed, neo-synephrine, vasopressin  Height:   Ht Readings from Last 1 Encounters:  05/16/2016 5\' 7"  (1.702 m)    Weight: no weight loss per recent weight encounters  Wt Readings from Last 1 Encounters:  05/08/16 211 lb 6.7 oz (95.9 kg)    Ideal Body Weight:     BMI:  Body mass index is 33.11 kg/(m^2).  Estimated Nutritional Needs:   Kcal:  [id number] kcals   Protein:  >/= 135 g of protein  Fluid:  >/= 2 L  EDUCATION NEEDS:   No education needs identified at this time  Romelle StarcherCate Labrandon Knoch MS, RD, LDN [phone] Pager  [phone] Weekend/On-Call Pager

## 2016-05-08 NOTE — Consult Note (Signed)
Pharmacy Antibiotic Note  Nathan Figueroa is a 75 y.o. male admitted on 05/17/2016 with sepsis.  Pharmacy has been consulted for vancomycin/zosyn dosing.  Plan: Continue vancomycin 1g IV every 24 hours.  Goal trough 15-20 mcg/mL. Continue Zosyn 3.375g IV q8h (4 hour infusion). Continue to monitor renal function for needed dose adjustments. Trough prior to 5th total dose 6/21 at 2000.  Height: 5\' 7"  (170.2 cm) Weight: 211 lb 6.7 oz (95.9 kg) IBW/kg (Calculated) : 66.1  Temp (24hrs), Avg:97.3 F (36.3 C), Min:91.8 F (33.2 C), Max:99.3 F (37.4 C)   Recent Labs Lab 04/20/2016 1349 04/21/2016 1925 05/08/16 0102 05/08/16 0442 05/08/16 0520 05/08/16 0657  WBC 38.0*  --  75.5*  --   --  68.4*  CREATININE 3.16* 2.99*  --   --  2.59*  --   LATICACIDVEN 2.7* 2.1*  --  1.9  --   --     Estimated Creatinine Clearance: 28 mL/min (by C-G formula based on Cr of 2.59).    No Known Allergies  Antimicrobials this admission: vancomycin 6/18 >>  zosyn 6/18 >>   Dose adjustments this admission:   Microbiology results: 6/19 C diff: pending 6/18 BCx: pending 6/18 UCx: pending 6/18 MRSA PCR: positive  Thank you for allowing pharmacy to be a part of this patient's care.  Luisa HartScott Plez Belton, PharmD Clinical Pharmacist     05/08/2016 12:12 PM

## 2016-05-08 NOTE — Progress Notes (Signed)
PULMONARY / CRITICAL CARE MEDICINE   Name: Nathan Figueroa MRN: [id number] DOB: 02/26/1941    ADMISSION DATE:  05-17-2016   REFERRING MD:  ED  CHIEF COMPLAINT:  AMS, hypotension and abdominal pain  HISTORY OF PRESENT ILLNESS:   This is a 75 yo caucasian male with a PMH of frequent UTIs, BPH, hypertension, anemia, bronchitis and paroxysmal tachycardia who presented to the ED with decreased level of consciousness, and abdominal pain. At the ED he was found to be hypotensive with MAPs in the low 50s. His UA was abnormal and a CT abdomen showed two adjacent stones lying in the mid to distal right ureter, largest measuring 6 mm, causing mild right hydroureteronephrosis. He was taken to the OR for a cystoscopy and a right ureteral stent placement. Postop, he remained hypotensive despite appetite, fluid resuscitation. He is currently intubated and sedated.  Patient was recently hospitalized from 04/15/2016 to 04/20/2016 and treated for urosepsis/UTI, non-ST elevation MI and AKI.  SUBJECTIVE: remains critically ill, severe acidosis and shock, resp failure On three pressors. Low urine output at the beginning of the shift but improved in the morning. 350 cc of bilious drainage from OGT. MRSA screen positive. No vent changes overnight.     VITAL SIGNS: BP 104/89 mmHg  Pulse 58  Temp(Src) 99.3 F (37.4 C) (Oral)  Resp 14  Ht 5\' 7"  (1.702 m)  Wt 211 lb 6.7 oz (95.9 kg)  BMI 33.11 kg/m2  SpO2 100%  HEMODYNAMICS: CVP:  [6 mmHg-17 mmHg] 16 mmHg  VENTILATOR SETTINGS: Vent Mode:  [-] PRVC FiO2 (%):  [0.6 %-60 %] 60 % Set Rate:  [15 bmp] 15 bmp Vt Set:  [500 mL] 500 mL PEEP:  [5 cmH20] 5 cmH20 Pressure Support:  [5 cmH20] 5 cmH20 Plateau Pressure:  [15 cmH20] 15 cmH20  INTAKE / OUTPUT:    PHYSICAL EXAMINATION: General: Acutely ill-looking  Neuro: Sedated; awakens and withdraws to noxious stimulus, gcs<8T HEENT:  Normocephalic and atraumatic, PERRLA, oral mucosa pink, ET/OG tubes in  place Cardiovascular: Rate and rhythm regular, S1, S2 audible, no murmur, regurg or gallop Lungs: Bilateral airflow, positive Rales and rhonchi in anterior  lung fields, no wheezing Abdomen: Mildly distended, Soft, nontender, normal bowel sounds Musculoskeletal:  No visible joint deformities. Extremities: +2 pulses bilaterally, no edema Skin:  Warm and dry, no discolorations  LABS:  BMET  Recent Labs Lab 2016-05-17 1349 05/17/16 1925 05/08/16 0520  NA 136 139 138  K 6.8* 4.9 5.8*  CL 101 111 113*  CO2 22 19* 17*  BUN 32* 35* 34*  CREATININE 3.16* 2.99* 2.59*  GLUCOSE 238* 183* 274*    Electrolytes  Recent Labs Lab 17-May-2016 1349 17-May-2016 1925 05/08/16 0520  CALCIUM 9.0 7.2* 6.7*  MG  --  2.6* 2.3  PHOS  --  3.5 4.1    CBC  Recent Labs Lab 05/17/2016 1349 05/08/16 0102  WBC 38.0* 75.5*  HGB 14.2 10.0*  HCT 42.8 31.7*  PLT 310 254    Coag's  Recent Labs Lab 05/17/16 1349 05/08/16 0520  APTT 28  --   INR 1.11 1.31    Sepsis Markers  Recent Labs Lab 05-17-2016 1349 05/17/16 1925 05/08/16 0442  LATICACIDVEN 2.7* 2.1* 1.9    ABG  Recent Labs Lab 17-May-2016 1349 05/17/16 1840  PHART 7.42 7.31*  PCO2ART 30* 36  PO2ART 80* 88    Liver Enzymes  Recent Labs Lab 05-17-16 1349 05/08/16 0520  AST 33 26  ALT 16* 12*  ALKPHOS  118 80  BILITOT 1.8* 1.3*  ALBUMIN 3.6 2.3*    Cardiac Enzymes  Recent Labs Lab 2016-02-16 1349 2016-02-16 1925 05/08/16 0054  TROPONINI <0.03 0.03 <0.03    Glucose  Recent Labs Lab 2016-02-16 1954 2016-02-16 2354 05/08/16 0407  GLUCAP 143* 172* 239*    Imaging Ct Abdomen Pelvis Wo Contrast  08-18-16  CLINICAL DATA:  Pt very poor historian. Pt states that his belly hurts. Per note in pt chart "Pt presents to ED from [facility] with a c/o altered mental status and code sepsis. Per EMS pt was hospitalized for a week for pneumonia and was started on levaquin yesterday. Per EMS pt is normally alert and  oriented, presents to ED confused and per EMS combative. Pt presents to ED with staff moaning and with incomprehensible speech. Pt noted to be pale on arrival." No IV contrast per GFR value, pt unable to drink PO contrast. EXAM: CT ABDOMEN AND PELVIS WITHOUT CONTRAST TECHNIQUE: Multidetector CT imaging of the abdomen and pelvis was performed following the standard protocol without IV contrast. COMPARISON:  None. FINDINGS: Lung bases: Right greater than left dependent linear and reticular type opacities likely due to atelectasis, scarring or a combination. Heart is normal in size. Hepatobiliary: Fatty infiltration of the liver. Low-density homogeneous mass in the right lobe adjacent to the gallbladder measuring 2 cm consistent with a cyst. No other liver masses or lesions. Gallbladder is abnormal appearance. It has increased attenuation material throughout with evidence of wall thickening and adjacent inflammation. No bile duct dilation. Spleen, pancreas, adrenal glands:  Unremarkable. Kidneys, ureters, bladder: Mild right hydronephrosis with hydroureter extending to the right mid to distal ureter at the level of the pelvic ran. There are 2 adjacent stones in this location, larger measuring 6 mm. There are no additional stones below this. There are multiple nonobstructing intrarenal stones on the right. No left intrarenal stones. There are low-density renal masses. There is a 4.5 cm midpole mass in the right and a 2.6 cm midpole mass on the left, both consistent with cysts. No left hydronephrosis. Normal left ureter. Bladder is unremarkable. There changes from previous prostate surgery. Lymph nodes:  No pathologically enlarged lymph nodes. Ascites:  None. Gastrointestinal: There is significant distention of the rectum with stool. There is some mild hazy inflammatory changes adjacent to the rectum and lower sigmoid colon. Scattered diverticula noted along the sigmoid colon. No evidence of diverticulitis. There is no  evidence of bowel obstruction. There is no bowel wall thickening. Stomach is unremarkable. Normal appendix visualized. 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 Degenerative changes are noted along the visualized spine. No osteoblastic or osteolytic lesions. IMPRESSION: 1. Two adjacent stones lie in the mid to distal right ureter, largest measuring 6 mm, causing mild right hydroureteronephrosis. 2. Gallbladder also appears abnormal with increased attenuation material and evidence of wall thickening and adjacent inflammatory change. Consider acute cholecystitis if this correlates clinically, which could be further assessed with limited right upper quadrant ultrasound. 3. Rectum is significantly distended with stool and there is mild hazy inflammatory change in the perirectal fat in the fat adjacent to the lower sigmoid colon. Consider proctitis if this patient has rectal/anal pain. 4. No other acute findings. 5. Hepatic steatosis. Hepatic cyst. Bilateral renal cysts. Nonobstructing stones in the right kidney. Sigmoid colon diverticula without diverticulitis. Arthropathic changes of the hips and degenerative changes of spine. Electronically Signed   By: Amie Portlandavid  Ormond M.D.   On: 009-29-17 15:26   Dg Abd 1  View  05/12/2016  CLINICAL DATA:  Orogastric tube placement.  Initial encounter. EXAM: ABDOMEN - 1 VIEW COMPARISON:  CT of the abdomen and pelvis performed earlier today at 3:08 p.m. FINDINGS: The patient's enteric tube is seen ending overlying the body of the stomach, with the side port about the fundus of the stomach. The visualized bowel gas pattern is grossly unremarkable. Air-filled loops of small and large bowel are noted. The stomach is partially filled with air. No free intra-abdominal air is seen, though evaluation for free air is limited on a single supine view. Clips are noted within the right upper quadrant, reflecting prior cholecystectomy. No acute osseous abnormalities are  identified. IMPRESSION: 1. Enteric tube noted ending overlying the body of the stomach, with the side port about the fundus of the stomach. 2. Air-filled loops of small and large bowel may reflect mild dysmotility. Electronically Signed   By: Roanna Raider M.D.   On: 05/05/2016 20:12   Dg Chest Port 1 View  05/17/2016  CLINICAL DATA:  Acute onset of hypotension and altered mental status. Urosepsis. Initial encounter. EXAM: PORTABLE CHEST 1 VIEW COMPARISON:  Chest radiograph performed earlier today at 2:09 p.m. FINDINGS: The patient's endotracheal tube is seen ending 5 cm above the carina. A left IJ line is noted ending about the mid SVC. Small bilateral pleural effusions are suggested. Mild bibasilar opacities may reflect pneumonia, given the patient's symptoms. No pneumothorax is seen. The cardiomediastinal silhouette is mildly enlarged. No acute osseous abnormalities are identified. IMPRESSION: 1. Endotracheal tube seen ending 5 cm above the carina. 2. Small bilateral pleural effusions suggested. Mild bibasilar opacities may reflect pneumonia, given the patient's symptoms. 3. Mild cardiomegaly. Electronically Signed   By: Roanna Raider M.D.   On: 05/08/2016 19:19   Dg Chest Port 1 View  04/29/2016  CLINICAL DATA:  Sepsis. EXAM: PORTABLE CHEST 1 VIEW COMPARISON:  Radiograph of Apr 17, 2016. FINDINGS: Stable cardiomediastinal silhouette. Right hilar clips are noted. No pneumothorax or pleural effusion is noted. No acute pulmonary disease is noted. Bony thorax is unremarkable. IMPRESSION: No acute cardiopulmonary abnormality seen. Electronically Signed   By: Lupita Raider, M.D.   On: 05/11/2016 14:29    STUDIES:  Last 2-D echo was 04/16/2016 and showed an EF of 35%  CULTURES: Blood cultures 2. Urine culture MRSA screen positive  ANTIBIOTICS: Vancomycin 05/12/2016. Zosyn 05/06/2016  SIGNIFICANT EVENTS: 05/01/2016: ED with altered mental status, hypotension, and abdominal pain, taken to the  OR for a right ureteral stent placement  LINES/TUBES: Left IJ 05/08/2016 ET tube 05/17/2016  DISCUSSION: 75 year old male presenting with right renal calculus, UTI, and sepsis; now status post cystoscopy with right ureteral stent placement and in septic shock  ASSESSMENT / PLAN:  PULMONARY A: Vent dependent respiratory failure due to sepsis P:   -Full vent support with current settings-FiO2 40%, PEEP of 5, rate of 15 and tidal volume of 500. -Daily chest x-ray. -Daily ABG -Daily weaning trials as tolerated  CARDIOVASCULAR A:  Septic shock-Remains on 3 pressors History of hypertension P:  -Monitor hemodynamics per ICU protocol -Vasopressin, Neo-Synephrine, and phenylephrine infusions; titrate to maintain mean arterial blood pressure greater than 65 -Continue IV fluid boluses for CVP readings less than 12 -stress dose steroids  RENAL A:   Acute renal failure secondary to obstructive uropathy/Rt ureteral calculus Hyperkalemia  P:   -IV fluids -Foley K per protocol -Monitor I's and O's. -Renally dose medications. -Avoid nephrotoxic medications  GASTROINTESTINAL A:   Abdominal distention P:   -  NG to intermittent suction -Pepcid for stress ulcer prophylaxis  HEMATOLOGIC A:   Anemia-blood loss versus chronic disease P:  -Monitor CBC and transfuse when necessary  INFECTIOUS A:   Sepsis of urinary source Severe leukocytosis P:   -Follow-up cultures -Broad-spectrum antibiotics as above  ENDOCRINE A:   Steroid-induced hyperglycemia without diabetes P:   -Point-of-care blood glucose monitoring every 4 hours with Sliding-scale insulin coverage  NEUROLOGIC A:   Acute metabolic and encephalopathy secondary to sepsis Vent associated discomfort P:   RASS goal: -1 to -2 -Fentanyl and Versed for vent sedation and comfort -Wake assessment every morning   Disposition and Family update: No family at bedside. Will update once available.   Best Practice: Code  Status:  Full. GI prophylaxis: H2 blocker. VTE prophylaxis:  SCD's / heparin.  Magdalene S. [facility] ANP-BC Pulmonary and Critical Care Medicine [facility] Pager [phone] or [phone]  05/08/2016, 5:48 AM  STAFF NOTE: I, Dr. Lucie Leather,  have personally reviewed patient's available data, including medical history, events of note, physical examination and test results as part of my evaluation. I have discussed with NP and other care providers such as pharmacist, RN and RRT.  In addition,  I personally evaluated patient and elicited key findings  Intubated,sedated   A:acute resp failure, severe septic shock  P: wean off vasopressors as tolerated Cont vent support    The Rest per NP whose note is outlined above and that I agree with  I have personally reviewed/obtained a history, examined the patient, evaluated Pertinent laboratory and RadioGraphic/imaging results, and  formulated the assessment and plan   The Patient requires high complexity decision making for assessment and support, frequent evaluation and titration of therapies, application of advanced monitoring technologies and extensive interpretation of multiple databases. Critical Care Time devoted to patient care services described in this note is 45 minutes.  This Critical care time does not reflrect procedure time or supervisory time of NP but could involve care discussion time Overall, patient is critically ill, prognosis is guarded.  Patient with Multiorgan failure and at high risk for cardiac arrest and death.    Lucie Leather, M.D.  [facility]  Medical Director [facility] Medical Director [facility] Cardio-Pulmonary Department

## 2016-05-08 NOTE — Anesthesia Postprocedure Evaluation (Signed)
Anesthesia Post Note  Patient: Rockie NeighboursRalph L Supan  Procedure(s) Performed: Procedure(s) (LRB): CYSTOSCOPY WITH RETROGRADE PYELOGRAM,  AND STENT PLACEMENT (Right)  Patient location during evaluation: ICU Anesthesia Type: General Level of consciousness: sedated Pain management: pain level controlled Vital Signs Assessment: post-procedure vital signs reviewed and stable Respiratory status: patient on ventilator - see flowsheet for VS Cardiovascular status: blood pressure returned to baseline and stable Postop Assessment: no signs of nausea or vomiting Anesthetic complications: no    Last Vitals:  Filed Vitals:   05/08/16 0645 05/08/16 0700  BP:  98/77  Pulse: 60 61  Temp:    Resp: 15 17    Last Pain:  Filed Vitals:   05/08/16 0710  PainSc: Asleep                 Starling Mannsurtis,  Bary Limbach A

## 2016-05-08 NOTE — Care Management (Signed)
Presents from [facility] with decreased level of consciousness and abdominal pain. CT scan showed 2 kidney stones. Surgery with stent placed. Post op patient became hypotensive and developed respiratory failure. He is now on 3 pressors, vented and a Full Code. No family has been present per staff.

## 2016-05-09 ENCOUNTER — Inpatient Hospital Stay: Payer: Medicare Other

## 2016-05-09 DIAGNOSIS — I959 Hypotension, unspecified: Secondary | ICD-10-CM | POA: Insufficient documentation

## 2016-05-09 DIAGNOSIS — R7989 Other specified abnormal findings of blood chemistry: Secondary | ICD-10-CM | POA: Insufficient documentation

## 2016-05-09 DIAGNOSIS — J9600 Acute respiratory failure, unspecified whether with hypoxia or hypercapnia: Secondary | ICD-10-CM | POA: Insufficient documentation

## 2016-05-09 LAB — BLOOD GAS, ARTERIAL
ACID-BASE DEFICIT: 14.9 mmol/L — AB (ref 0.0–2.0)
ALLENS TEST (PASS/FAIL): POSITIVE — AB
Bicarbonate: 12.9 mEq/L — ABNORMAL LOW (ref 21.0–28.0)
FIO2: 0.4
LHR: 15 {breaths}/min
MECHVT: 500 mL
O2 SAT: 91.9 %
PATIENT TEMPERATURE: 37
PCO2 ART: 37 mmHg (ref 32.0–48.0)
PEEP: 5 cmH2O
PH ART: 7.15 — AB (ref 7.350–7.450)
PO2 ART: 82 mmHg — AB (ref 83.0–108.0)

## 2016-05-09 LAB — BASIC METABOLIC PANEL
Anion gap: 6 (ref 5–15)
BUN: 39 mg/dL — AB (ref 6–20)
CHLORIDE: 111 mmol/L (ref 101–111)
CO2: 19 mmol/L — AB (ref 22–32)
CREATININE: 2.36 mg/dL — AB (ref 0.61–1.24)
Calcium: 6.1 mg/dL — CL (ref 8.9–10.3)
GFR calc non Af Amer: 26 mL/min — ABNORMAL LOW (ref >60)
GFR, EST AFRICAN AMERICAN: 30 mL/min — AB (ref >60)
Glucose, Bld: 148 mg/dL — ABNORMAL HIGH (ref 65–99)
Potassium: 5.1 mmol/L (ref 3.5–5.1)
Sodium: 136 mmol/L (ref 135–145)

## 2016-05-09 LAB — CBC
HEMATOCRIT: 29.6 % — AB (ref 40.0–52.0)
HEMOGLOBIN: 9.2 g/dL — AB (ref 13.0–18.0)
MCH: 29.2 pg (ref 26.0–34.0)
MCHC: 31 g/dL — AB (ref 32.0–36.0)
MCV: 94.4 fL (ref 80.0–100.0)
Platelets: 245 10*3/uL (ref 150–440)
RBC: 3.14 MIL/uL — ABNORMAL LOW (ref 4.40–5.90)
RDW: 16.2 % — ABNORMAL HIGH (ref 11.5–14.5)
WBC: 58.9 10*3/uL (ref 3.8–10.6)

## 2016-05-09 LAB — GLUCOSE, CAPILLARY
GLUCOSE-CAPILLARY: 145 mg/dL — AB (ref 65–99)
GLUCOSE-CAPILLARY: 130 mg/dL — AB (ref 65–99)
GLUCOSE-CAPILLARY: 135 mg/dL — AB (ref 65–99)
Glucose-Capillary: 129 mg/dL — ABNORMAL HIGH (ref 65–99)

## 2016-05-09 MED ORDER — STERILE WATER FOR INJECTION IV SOLN
INTRAVENOUS | Status: AC
  Administered 2016-05-09: 09:00:00 via INTRAVENOUS
  Filled 2016-05-09 (×3): qty 850

## 2016-05-09 MED ORDER — STERILE WATER FOR INJECTION IV SOLN
INTRAVENOUS | Status: DC
  Administered 2016-05-09 – 2016-05-12 (×8): via INTRAVENOUS
  Filled 2016-05-09 (×15): qty 1000

## 2016-05-09 MED ORDER — POLYVINYL ALCOHOL 1.4 % OP SOLN
1.0000 [drp] | OPHTHALMIC | Status: DC | PRN
  Filled 2016-05-09: qty 15

## 2016-05-09 MED ORDER — SODIUM CHLORIDE 0.9 % IV BOLUS (SEPSIS)
1000.0000 mL | Freq: Once | INTRAVENOUS | Status: AC
  Administered 2016-05-09: 1000 mL via INTRAVENOUS

## 2016-05-09 NOTE — Progress Notes (Signed)
[facility] MD Dr. Craige CottaSood notified of pt's urine output, will continue to monitor, no new orders given at this time.

## 2016-05-09 NOTE — Progress Notes (Signed)
Dr. Craige CottaSood from [facility] notified of patients decrease in urine output. NS bolus ordered.

## 2016-05-09 NOTE — Progress Notes (Signed)
PULMONARY / CRITICAL CARE MEDICINE   Name: Nathan Figueroa MRN: [id number] DOB: 05/23/1941    ADMISSION DATE:  04/27/2016   REFERRING MD:  ED  CHIEF COMPLAINT:  AMS, hypotension and abdominal pain  HISTORY OF PRESENT ILLNESS:   This is a 75 yo caucasian male with a PMH of frequent UTIs, BPH, hypertension, anemia, bronchitis and paroxysmal tachycardia who presented to the ED with decreased level of consciousness, and abdominal pain. At the ED he was found to be hypotensive with MAPs in the low 50s. His UA was abnormal and a CT abdomen showed two adjacent stones lying in the mid to distal right ureter, largest measuring 6 mm, causing mild right hydroureteronephrosis. He was taken to the OR for a cystoscopy and a right ureteral stent placement. Postop, he remained hypotensive despite appetite, fluid resuscitation. He is currently intubated and sedated.  Patient was recently hospitalized from 04/15/2016 to 04/20/2016 and treated for urosepsis/UTI, non-ST elevation MI and AKI.  SUBJECTIVE: remains critically ill, severe acidosis and shock, resp failure On 2 pressors. Distended ABD today-hold TF's for now  MRSA screen positive. No vent changes overnight.  ABG and LA pending    VITAL SIGNS: BP 85/67 mmHg  Pulse 73  Temp(Src) 98.5 F (36.9 C) (Oral)  Resp 15  Ht 5\' 7"  (1.702 m)  Wt 219 lb 9.3 oz (99.6 kg)  BMI 34.38 kg/m2  SpO2 95%  HEMODYNAMICS: CVP:  [10 mmHg-33 mmHg] 18 mmHg  VENTILATOR SETTINGS: Vent Mode:  [-] PRVC FiO2 (%):  [40 %] 40 % Set Rate:  [15 bmp] 15 bmp Vt Set:  [500 mL] 500 mL PEEP:  [5 cmH20] 5 cmH20 Plateau Pressure:  [32 cmH20] 32 cmH20  INTAKE / OUTPUT: I/O last 3 completed shifts: In: 13794.9 [I.V.:7909.9; NG/GT:535; IV Piggyback:5350] Out: 2982 [Urine:2430; Emesis/NG output:450; Stool:102]  PHYSICAL EXAMINATION: General: Acutely ill-looking  Neuro: Sedated; awakens and withdraws to noxious stimulus, gcs<8T HEENT:  Normocephalic and atraumatic,  PERRLA, oral mucosa pink, ET/OG tubes in place Cardiovascular: Rate and rhythm regular, S1, S2 audible, no murmur, regurg or gallop Lungs: Bilateral airflow, positive Rales and rhonchi in anterior  lung fields, no wheezing Abdomen: Mildly distended, Soft, nontender, normal bowel sounds Musculoskeletal:  No visible joint deformities. Extremities: +2 pulses bilaterally, no edema Skin:  Warm and dry, no discolorations  LABS:  BMET  Recent Labs Lab 05/06/2016 1925 05/08/16 0520 05/09/16 0422  NA 139 138 136  K 4.9 5.8* 5.1  CL 111 113* 111  CO2 19* 17* 19*  BUN 35* 34* 39*  CREATININE 2.99* 2.59* 2.36*  GLUCOSE 183* 274* 148*    Electrolytes  Recent Labs Lab 05/05/2016 1925 05/08/16 0520 05/09/16 0422  CALCIUM 7.2* 6.7* 6.1*  MG 2.6* 2.3  --   PHOS 3.5 4.1  --     CBC  Recent Labs Lab 05/08/16 0102 05/08/16 0657 05/09/16 0422  WBC 75.5* 68.4* 58.9*  HGB 10.0* 10.2* 9.2*  HCT 31.7* 33.2* 29.6*  PLT 254 268 245    Coag's  Recent Labs Lab 05/08/2016 1349 05/08/16 0520  APTT 28  --   INR 1.11 1.31    Sepsis Markers  Recent Labs Lab 05/06/2016 1349 05/12/2016 1925 05/08/16 0442 05/08/16 0520  LATICACIDVEN 2.7* 2.1* 1.9  --   PROCALCITON  --   --   --  30.10    ABG  Recent Labs Lab 05/13/2016 1349 05/15/2016 1840 05/08/16 0627  PHART 7.42 7.31* 7.24*  PCO2ART 30* 36 39  PO2ART 80*  88 117*    Liver Enzymes  Recent Labs Lab 04/25/2016 1349 05/08/16 0520  AST 33 26  ALT 16* 12*  ALKPHOS 118 80  BILITOT 1.8* 1.3*  ALBUMIN 3.6 2.3*    Cardiac Enzymes  Recent Labs Lab 05/19/2016 1925 05/08/16 0054 05/08/16 0657  TROPONINI 0.03 <0.03 0.03    Glucose  Recent Labs Lab 05/08/16 0749 05/08/16 1148 05/08/16 1637 05/08/16 2001 05/08/16 2348 05/09/16 0409  GLUCAP 225* 243* 193* 167* 155* 129*    Imaging No results found.  STUDIES:  Last 2-D echo was 04/16/2016 and showed an EF of 35%  CULTURES: Blood cultures 2. Urine  culture MRSA screen positive  ANTIBIOTICS: Vancomycin 05/06/2016. Zosyn 05/04/2016  SIGNIFICANT EVENTS: 04/27/2016: ED with altered mental status, hypotension, and abdominal pain, taken to the OR for a right ureteral stent placement  LINES/TUBES: Left IJ 05/17/2016 ET tube 05/17/2016  DISCUSSION: 75 year old male presenting with right renal calculus, UTI, and sepsis; now status post cystoscopy with right ureteral stent placement and in septic shock  ASSESSMENT / PLAN:  PULMONARY A: Vent dependent respiratory failure due to sepsis P:   -Full vent support with current settings-FiO2 40%, PEEP of 5, rate of 15 and tidal volume of 500. -Daily chest x-ray. -Daily ABG -Daily weaning trials as tolerated -abg and LA pending  CARDIOVASCULAR A:  Septic shock-Remains on 3 pressors History of hypertension P:  -Monitor hemodynamics per ICU protocol -; titrate to maintain mean arterial blood pressure greater than 65 -Continue IV fluid boluses for CVP readings less than 12 -stress dose steroids  RENAL A:   Acute renal failure secondary to obstructive uropathy/Rt ureteral calculus Hyperkalemia  P:   -IV fluids -Foley K per protocol -Monitor I's and O's. -Renally dose medications. -Avoid nephrotoxic medications  GASTROINTESTINAL A:   Abdominal distention P:   -NG to intermittent suction -Pepcid for stress ulcer prophylaxis  HEMATOLOGIC A:   Anemia-blood loss versus chronic disease P:  -Monitor CBC and transfuse when necessary  INFECTIOUS A:   Sepsis of urinary source Severe leukocytosis P:   -Follow-up cultures -Broad-spectrum antibiotics as above  ENDOCRINE A:   Steroid-induced hyperglycemia without diabetes P:   -Point-of-care blood glucose monitoring every 4 hours with Sliding-scale insulin coverage  NEUROLOGIC A:   Acute metabolic and encephalopathy secondary to sepsis Vent associated discomfort P:   RASS goal: -1 to -2 -Fentanyl and Versed for  vent sedation and comfort -Wake assessment every morning   Disposition and Family update: No family at bedside. Will update once available.   Best Practice: Code Status:  Full. GI prophylaxis: H2 blocker. VTE prophylaxis:  SCD's / heparin.   I have personally obtained a history, examined the patient, evaluated Pertinent laboratory and RadioGraphic/imaging results, and  formulated the assessment and plan   The Patient requires high complexity decision making for assessment and support, frequent evaluation and titration of therapies, application of advanced monitoring technologies and extensive interpretation of multiple databases. Critical Care Time devoted to patient care services described in this note is 40 minutes.   Overall, patient is critically ill, prognosis is guarded.  Patient with Multiorgan failure and at high risk for cardiac arrest and death.    Nathan Figueroa, M.D.  [facility]  Medical Director [facility] Medical Director [facility] Cardio-Pulmonary Department

## 2016-05-09 NOTE — Progress Notes (Signed)
I have discussed with sister and I have explained that patient is in critical condition and that he is at high risk for cardiac arrest and death.   The sister has consented and has agreed to DNR status and when it comes to extubation, will NOT re-intubate  Family  satisfied with Plan of action and management. All questions answered  Lucie LeatherKurian David Monique Hefty, M.D.  [facility]  Medical Director [facility] Medical Director [facility] Cardio-P[facility] Department

## 2016-05-09 NOTE — Progress Notes (Signed)
eLink Physician-Brief Progress Note Patient Name: Rockie NeighboursRalph L Paule DOB: 02/14/1941 MRN: [id number]   Date of Service  05/09/2016  HPI/Events of Note  Hypotensive, oliguric.  eICU Interventions  Will give 1 liter NS fluid bolus.     Intervention Category Major Interventions: Other:  Gavrielle Streck 05/09/2016, 4:00 AM

## 2016-05-09 NOTE — Progress Notes (Signed)
elink MD Dr. Craige CottaSood notified about patients corrected calcium of 7.46, no new orders written will continue to monitor at this time.

## 2016-05-09 NOTE — Progress Notes (Signed)
Notified MD Kasa that patients abdomen was distended and firm, more distention that yesterday. MD Kasa stated to place tube feedings on hold, see new order.  Will continue to monitor patient.

## 2016-05-09 NOTE — Consult Note (Signed)
Pharmacy Antibiotic Note  Nathan NeighboursRalph L Figueroa is a 75 y.o. male admitted on 05/11/2016 with sepsis.  Pharmacy has been consulted for vancomycin/zosyn dosing.  Plan: Patient with positive MRSA PCR but source of infection likely sepsis and less likely PNA. After discussion with Dr. Belia HemanKasa, will d/c vancomycin.  Continue Zosyn 3.375g IV q8h (4 hour infusion).   Height: 5\' 11"  (180.3 cm) Weight: 219 lb 9.3 oz (99.6 kg) IBW/kg (Calculated) : 75.3  Temp (24hrs), Avg:98.3 F (36.8 C), Min:97.9 F (36.6 C), Max:98.5 F (36.9 C)   Recent Labs Lab 04/26/2016 1349 04/30/2016 1925 05/08/16 0102 05/08/16 0442 05/08/16 0520 05/08/16 0657 05/09/16 0422  WBC 38.0*  --  75.5*  --   --  68.4* 58.9*  CREATININE 3.16* 2.99*  --   --  2.59*  --  2.36*  LATICACIDVEN 2.7* 2.1*  --  1.9  --   --   --     Estimated Creatinine Clearance: 33.5 mL/min (by C-G formula based on Cr of 2.36).    No Known Allergies  Antimicrobials this admission: vancomycin 6/18 >> 6/20 zosyn 6/18 >>   Dose adjustments this admission:   Microbiology results: 6/19 C diff: NG 6/18 BCx: NGTD 6/18 UCx: NG 6/18 MRSA PCR: positive  Thank you for allowing pharmacy to be a part of this patient's care.  Luisa HartScott Mads Borgmeyer, PharmD Clinical Pharmacist     05/09/2016 11:06 AM

## 2016-05-09 NOTE — Progress Notes (Signed)
Nutrition Follow-up  DOCUMENTATION CODES:   Not applicable  INTERVENTION:  -TF: discussed nutritional poc during ICU rounds; plan to continue to hold TF today and reassess on follow tomorrow .    NUTRITION DIAGNOSIS:   Inadequate oral intake related to acute illness as evidenced by NPO status.  Continues  GOAL:   Provide needs based on ASPEN/SCCM guidelines  MONITOR:   TF tolerance, Vent status, Labs, Weight trends, I & O's  REASON FOR ASSESSMENT:   Ventilator, Consult Enteral/tube feeding initiation and management  ASSESSMENT:   75 yo male admitted with abdominal pain, septic shock with UTI, vent dependent respiratory failure. Pt with hx of cognitive communication deficit, bed bound at baseline  Pt remains sedated on vent, off neosynephrine but levophed increased, on vasopressin as well. TF started yesterday but on hold due to worsening abdominal distention, OG currently clamped per MD order  Diet Order:   NPO  Skin:  Reviewed, no issues  Last BM:  05/08/16 multiple loose stools, C.diff pending   Labs: reviewed  Meds: reviewed  Height:   Ht Readings from Last 1 Encounters:  05/09/16 5\' 11"  (1.803 m)    Weight:   Wt Readings from Last 1 Encounters:  05/09/16 219 lb 9.3 oz (99.6 kg)    Ideal Body Weight:     BMI:  Body mass index is 30.64 kg/(m^2).  Estimated Nutritional Needs:   Kcal:  [id number] kcals   Protein:  >/= 135 g of protein  Fluid:  >/= 2 L  EDUCATION NEEDS:   No education needs identified at this time  Romelle StarcherCate Jozy Mcphearson MS, RD, LDN [phone] Pager  [phone] Weekend/On-Call Pager

## 2016-05-10 LAB — COMPREHENSIVE METABOLIC PANEL
ALBUMIN: 2.1 g/dL — AB (ref 3.5–5.0)
ALK PHOS: 75 U/L (ref 38–126)
ALT: 10 U/L — AB (ref 17–63)
ANION GAP: 11 (ref 5–15)
AST: 14 U/L — ABNORMAL LOW (ref 15–41)
BILIRUBIN TOTAL: 0.6 mg/dL (ref 0.3–1.2)
BUN: 36 mg/dL — ABNORMAL HIGH (ref 6–20)
CO2: 24 mmol/L (ref 22–32)
Calcium: 6 mg/dL — CL (ref 8.9–10.3)
Chloride: 98 mmol/L — ABNORMAL LOW (ref 101–111)
Creatinine, Ser: 1.96 mg/dL — ABNORMAL HIGH (ref 0.61–1.24)
GFR calc non Af Amer: 32 mL/min — ABNORMAL LOW (ref >60)
GFR, EST AFRICAN AMERICAN: 37 mL/min — AB (ref >60)
GLUCOSE: 113 mg/dL — AB (ref 65–99)
Potassium: 4.2 mmol/L (ref 3.5–5.1)
Sodium: 133 mmol/L — ABNORMAL LOW (ref 135–145)
Total Protein: 6 g/dL — ABNORMAL LOW (ref 6.5–8.1)

## 2016-05-10 LAB — BLOOD GAS, ARTERIAL
ACID-BASE DEFICIT: 2 mmol/L (ref 0.0–2.0)
Allens test (pass/fail): POSITIVE — AB
Bicarbonate: 24.2 mEq/L (ref 21.0–28.0)
FIO2: 0.4
MECHVT: 500 mL
O2 SAT: 88.3 %
PATIENT TEMPERATURE: 37
PCO2 ART: 47 mmHg (ref 32.0–48.0)
PEEP: 5 cmH2O
PH ART: 7.32 — AB (ref 7.350–7.450)
PO2 ART: 60 mmHg — AB (ref 83.0–108.0)
RATE: 15 resp/min

## 2016-05-10 LAB — CBC
HCT: 27.4 % — ABNORMAL LOW (ref 40.0–52.0)
HEMOGLOBIN: 9.1 g/dL — AB (ref 13.0–18.0)
MCH: 29.6 pg (ref 26.0–34.0)
MCHC: 33.2 g/dL (ref 32.0–36.0)
MCV: 89.4 fL (ref 80.0–100.0)
Platelets: 179 10*3/uL (ref 150–440)
RBC: 3.06 MIL/uL — AB (ref 4.40–5.90)
RDW: 15.3 % — ABNORMAL HIGH (ref 11.5–14.5)
WBC: 50.2 10*3/uL (ref 3.8–10.6)

## 2016-05-10 LAB — GLUCOSE, CAPILLARY
GLUCOSE-CAPILLARY: 138 mg/dL — AB (ref 65–99)
Glucose-Capillary: 124 mg/dL — ABNORMAL HIGH (ref 65–99)
Glucose-Capillary: 93 mg/dL (ref 65–99)
Glucose-Capillary: 108 mg/dL — ABNORMAL HIGH (ref 65–99)
Glucose-Capillary: 114 mg/dL — ABNORMAL HIGH (ref 65–99)
Glucose-Capillary: 141 mg/dL — ABNORMAL HIGH (ref 65–99)
Glucose-Capillary: 83 mg/dL (ref 65–99)

## 2016-05-10 LAB — LACTIC ACID, PLASMA: Lactic Acid, Venous: 1.8 mmol/L (ref 0.5–2.0)

## 2016-05-10 MED ORDER — SODIUM CHLORIDE 0.9 % IV BOLUS (SEPSIS)
250.0000 mL | Freq: Once | INTRAVENOUS | Status: AC
  Administered 2016-05-10: 250 mL via INTRAVENOUS

## 2016-05-10 MED ORDER — MUPIROCIN 2 % EX OINT
1.0000 "application " | TOPICAL_OINTMENT | Freq: Two times a day (BID) | CUTANEOUS | Status: DC
  Administered 2016-05-10 – 2016-05-13 (×7): 1 via NASAL
  Filled 2016-05-10: qty 22

## 2016-05-10 MED ORDER — SODIUM CHLORIDE 0.9 % IV SOLN
1.0000 g | Freq: Once | INTRAVENOUS | Status: AC
  Administered 2016-05-10: 1 g via INTRAVENOUS
  Filled 2016-05-10: qty 10

## 2016-05-10 MED ORDER — FENTANYL BOLUS VIA INFUSION
100.0000 ug | Freq: Once | INTRAVENOUS | Status: AC
  Administered 2016-05-10: 100 ug via INTRAVENOUS
  Filled 2016-05-10: qty 100

## 2016-05-10 MED ORDER — CHLORHEXIDINE GLUCONATE CLOTH 2 % EX PADS
6.0000 | MEDICATED_PAD | Freq: Every day | CUTANEOUS | Status: DC
  Administered 2016-05-11 – 2016-05-13 (×3): 6 via TOPICAL

## 2016-05-10 MED ORDER — MIDAZOLAM HCL 2 MG/2ML IJ SOLN
1.0000 mg | INTRAMUSCULAR | Status: DC | PRN
Start: 2016-05-10 — End: 2016-05-13
  Administered 2016-05-10 – 2016-05-11 (×3): 1 mg via INTRAVENOUS
  Administered 2016-05-11: 05:00:00 via INTRAVENOUS
  Administered 2016-05-13: 1 mg via INTRAVENOUS
  Filled 2016-05-10 (×6): qty 2

## 2016-05-10 MED ORDER — SODIUM CHLORIDE 0.9 % IV SOLN
1.0000 g | Freq: Once | INTRAVENOUS | Status: AC
  Administered 2016-05-11: 1 g via INTRAVENOUS
  Filled 2016-05-10: qty 10

## 2016-05-10 NOTE — Consult Note (Addendum)
Pharmacy Antibiotic Note  Nathan Figueroa is a 75 y.o. male admitted on Jul 01, 2016 with sepsis.  Pharmacy has been consulted for vancomycin/zosyn dosing. Vancomycin d/c 6/20.   Plan: Continue Zosyn 3.375g IV q8h (4 hour infusion).  With cultures negative to date, will f/u plans for deescalation of abx/duration of therapy. After discussion with Dr. Belia HemanKasa, will d/c Zosyn after a total of 7 days of therapy.     Height: 5\' 11"  (180.3 cm) Weight: 228 lb 13.4 oz (103.8 kg) IBW/kg (Calculated) : 75.3  Temp (24hrs), Avg:97.8 F (36.6 C), Min:97 F (36.1 C), Max:98.4 F (36.9 C)   Recent Labs Lab 2016/04/12 1349 2016/04/12 1925 05/08/16 0102 05/08/16 0442 05/08/16 0520 05/08/16 0657 05/09/16 0422 05/10/16 0842 05/10/16 0848  WBC 38.0*  --  75.5*  --   --  68.4* 58.9* 50.2*  --   CREATININE 3.16* 2.99*  --   --  2.59*  --  2.36* 1.96*  --   LATICACIDVEN 2.7* 2.1*  --  1.9  --   --   --   --  1.8    Estimated Creatinine Clearance: 41.2 mL/min (by C-G formula based on Cr of 1.96).    No Known Allergies  Antimicrobials this admission: vancomycin 6/18 >> 6/20 zosyn 6/18 >>   Dose adjustments this admission:   Microbiology results: 6/19 C diff: NG 6/18 BCx: NGTD 6/18 UCx: NG 6/18 MRSA PCR: positive  Thank you for allowing pharmacy to be a part of this patient's care.  Luisa HartScott Londin Antone, PharmD Clinical Pharmacist     05/10/2016 10:46 AM

## 2016-05-10 NOTE — Progress Notes (Signed)
Family Discussion held with Daughter, Son and [place]. I have updated them about clinical status of patient. They have all consented and agreed to remain DNR status  Plan as follows: 1.wean vasopressors as tolerated 2.SBT in AM tomorrow, hold TF's for now 3.will assess resp status after SAT/SBT in AM  And establish plan of care and establish goals of care with family. ie.Re-intubation  Family  satisfied with Plan of action and management. All questions answered  Lucie LeatherKurian David Abrian Hanover, M.D.  [facility]  Medical Director [facility] Medical Director [facility] Cardio[facility]

## 2016-05-10 NOTE — Progress Notes (Signed)
DC trigliceride labs per NP [place], patient not on propofol infusion

## 2016-05-10 NOTE — Progress Notes (Signed)
PULMONARY / CRITICAL CARE MEDICINE   Name: Nathan Figueroa MRN: [id number] DOB: 03/22/1941    ADMISSION DATE:  05/05/2016   REFERRING MD:  ED  CHIEF COMPLAINT:  AMS, hypotension and abdominal pain  HISTORY OF PRESENT ILLNESS:   This is a 75 yo caucasian male with a PMH of frequent UTIs, BPH, hypertension, anemia, bronchitis and paroxysmal tachycardia who presented to the ED with decreased level of consciousness, and abdominal pain. At the ED he was found to be hypotensive with MAPs in the low 50s. His UA was abnormal and a CT abdomen showed two adjacent stones lying in the mid to distal right ureter, largest measuring 6 mm, causing mild right hydroureteronephrosis. He was taken to the OR for a cystoscopy and a right ureteral stent placement. Postop, he remained hypotensive despite appetite, fluid resuscitation. He is currently intubated and sedated.  Patient was recently hospitalized from 04/15/2016 to 04/20/2016 and treated for urosepsis/UTI, non-ST elevation MI and AKI.  SUBJECTIVE: remains critically ill, severe acidosis and shock, resp failure On 2 pressors. Distended ABD today-hold TF's for now  MRSA screen positive. Increased HR, drop in o2 sats this AM Family meeting at 11 pending  ABG and LA pending today   VITAL SIGNS: BP 112/79 mmHg  Pulse 75  Temp(Src) 97.6 F (36.4 C) (Axillary)  Resp 11  Ht 5\' 11"  (1.803 m)  Wt 228 lb 13.4 oz (103.8 kg)  BMI 31.93 kg/m2  SpO2 92%  HEMODYNAMICS: CVP:  [14 mmHg-20 mmHg] 18 mmHg  VENTILATOR SETTINGS: Vent Mode:  [-] PRVC FiO2 (%):  [35 %-40 %] 35 % Set Rate:  [15 bmp] 15 bmp Vt Set:  [50 mL-500 mL] 500 mL PEEP:  [5 cmH20] 5 cmH20 Plateau Pressure:  [14 cmH20-25 cmH20] 14 cmH20  INTAKE / OUTPUT: I/O last 3 completed shifts: In: 9221 [I.V.:7301; Other:10; NG/GT:310; IV Piggyback:1600] Out: 2895 [Urine:2045; Emesis/NG output:550; Stool:300]  PHYSICAL EXAMINATION: General: Acutely ill-looking  Neuro: Sedated; awakens and  withdraws to noxious stimulus, gcs<8T HEENT:  Normocephalic and atraumatic, PERRLA, oral mucosa pink, ET/OG tubes in place Cardiovascular: Rate and rhythm regular, S1, S2 audible, no murmur, regurg or gallop Lungs: Bilateral airflow, positive Rales and rhonchi in anterior  lung fields, no wheezing Abdomen: Mildly distended, Soft, nontender, normal bowel sounds Musculoskeletal:  No visible joint deformities. Extremities: +2 pulses bilaterally, no edema Skin:  Warm and dry, no discolorations  LABS:  BMET  Recent Labs Lab 05/11/2016 1925 05/08/16 0520 05/09/16 0422  NA 139 138 136  K 4.9 5.8* 5.1  CL 111 113* 111  CO2 19* 17* 19*  BUN 35* 34* 39*  CREATININE 2.99* 2.59* 2.36*  GLUCOSE 183* 274* 148*    Electrolytes  Recent Labs Lab 04/20/2016 1925 05/08/16 0520 05/09/16 0422  CALCIUM 7.2* 6.7* 6.1*  MG 2.6* 2.3  --   PHOS 3.5 4.1  --     CBC  Recent Labs Lab 05/08/16 0102 05/08/16 0657 05/09/16 0422  WBC 75.5* 68.4* 58.9*  HGB 10.0* 10.2* 9.2*  HCT 31.7* 33.2* 29.6*  PLT 254 268 245    Coag's  Recent Labs Lab 04/26/2016 1349 05/08/16 0520  APTT 28  --   INR 1.11 1.31    Sepsis Markers  Recent Labs Lab 04/25/2016 1349 05/11/2016 1925 05/08/16 0442 05/08/16 0520  LATICACIDVEN 2.7* 2.1* 1.9  --   PROCALCITON  --   --   --  30.10    ABG  Recent Labs Lab 04/25/2016 1840 05/08/16 0627 05/09/16 0746  PHART 7.31* 7.24* 7.15*  PCO2ART 36 39 37  PO2ART 88 117* 82*    Liver Enzymes  Recent Labs Lab 05/03/2016 1349 05/08/16 0520  AST 33 26  ALT 16* 12*  ALKPHOS 118 80  BILITOT 1.8* 1.3*  ALBUMIN 3.6 2.3*    Cardiac Enzymes  Recent Labs Lab 04/28/2016 1925 05/08/16 0054 05/08/16 0657  TROPONINI 0.03 <0.03 0.03    Glucose  Recent Labs Lab 05/09/16 0801 05/09/16 1107 05/09/16 1556 05/09/16 1952 05/09/16 2331 05/10/16 0710  GLUCAP 145* 130* 135* 138* 124* 93    Imaging US Venous Img Upper Uni Left  05/09/2016  CLINICAL DATA:   Left arm swelling x1 day. EXAM: LEFT UPPER EXTREMITY VENOUS DOPPLER ULTRASOUND TECHNIQUE: Gray-scale sonography with graded compression, as well as color Doppler and duplex ultrasound were performed to evaluate the upper extremity deep venous system from the level of the subclavian vein and including the jugular, axillary, basilic and upper cephalic vein. Spectral Doppler was utilized to evaluate flow at rest and with distal augmentation maneuvers. COMPARISON:  None. FINDINGS: Thrombus within deep veins:  None visualized. Compressibility of deep veins:  Normal. Duplex waveform respiratory phasicity:  Normal. Duplex waveform response to augmentation:  Normal. Venous reflux:  None visualized. Other findings: Limited images of the contralateral right subclavian vein are unremarkable. IMPRESSION: 1. Negative for left upper extremity DVT. Electronically Signed   By: Corlis Leak M.D.   On: 05/09/2016 20:23    STUDIES:  Last 2-D echo was 04/16/2016 and showed an EF of 35%  CULTURES: Blood cultures 2. Urine culture MRSA screen positive  ANTIBIOTICS: Vancomycin 04/20/2016. Zosyn 04/29/2016  SIGNIFICANT EVENTS: 05/04/2016: ED with altered mental status, hypotension, and abdominal pain, taken to the OR for a right ureteral stent placement  LINES/TUBES: Left IJ 05/05/2016 ET tube 04/27/2016  DISCUSSION: 75 year old male presenting with right renal calculus, UTI, and sepsis; now status post cystoscopy with right ureteral stent placement and in septic shock and acute resp failure  ASSESSMENT / PLAN:  PULMONARY A: Vent dependent respiratory failure due to sepsis P:   -Full vent support with current settings-FiO2 40%, PEEP of 5, rate of 15 and tidal volume of 500. -cxr as needed -Daily ABG -abg and LA pending today  CARDIOVASCULAR A:  Septic shock-Remains on 3 pressors History of hypertension P:  -Monitor hemodynamics per ICU protocol -titrate to maintain mean arterial blood pressure  greater than 65 -Continue IV fluid boluses for CVP readings less than 12 -stress dose steroids  RENAL A:   Acute renal failure secondary to obstructive uropathy/Rt ureteral calculus Hyperkalemia  P:   -IV fluids -Foley K per protocol -Monitor I's and O's. -Renally dose medications. -Avoid nephrotoxic medications  GASTROINTESTINAL A:   Abdominal distention P:   -NG to intermittent suction -Pepcid for stress ulcer prophylaxis  HEMATOLOGIC A:   Anemia-blood loss versus chronic disease P:  -Monitor CBC and transfuse when necessary  INFECTIOUS A:   Sepsis of urinary source Severe leukocytosis P:   -Follow-up cultures -Broad-spectrum antibiotics as above  ENDOCRINE A:   Steroid-induced hyperglycemia without diabetes P:   -Point-of-care blood glucose monitoring every 4 hours with Sliding-scale insulin coverage  NEUROLOGIC A:   Acute metabolic and encephalopathy secondary to sepsis Vent associated discomfort P:   RASS goal: -1 to -2 -Fentanyl and Versed for vent sedation and comfort -Wake assessment every morning   Disposition and Family update: No family at bedside. Will update once available.   Best Practice: Code Status:  Full.  GI prophylaxis: H2 blocker. VTE prophylaxis:  SCD's / heparin.     The Patient requires high complexity decision making for assessment and support, frequent evaluation and titration of therapies, application of advanced monitoring technologies and extensive interpretation of multiple databases. Critical Care Time devoted to patient care services described in this note is 40 minutes.   Overall, patient is critically ill, prognosis is guarded.  Patient with Multiorgan failure and at high risk for cardiac arrest and death.   Family meeting to be set for today at 11AM   Lucie Leather, M.D.  [facility]  Medical Director [facility] Medical Director [facility] Cardio-Pulmonary Department

## 2016-05-10 NOTE — Progress Notes (Signed)
eLink Physician-Brief Progress Note Patient Name: Nathan NeighboursRalph L Figueroa DOB: 09/29/1941 MRN: [id number]   Date of Service  05/10/2016  HPI/Events of Note  Elevated HR in the 140s    eICU Interventions  Sinus on the monitor. Pain control with fentanyl 100mcg x 1 NS 250cc bolus x 1  CaGluc 1g x 1 Cont to monitor.      Intervention Category Major Interventions: Shock - evaluation and management  Nathan Figueroa 05/10/2016, 11:36 PM

## 2016-05-10 NOTE — Progress Notes (Signed)
Patient agitated this AM, responded well to Versed IV push and Fentanyl bolus. Levofed off at end of shift d/t MAP > 90. MAP in 70s now. Gag reflex intact, possible extubate attempt tomorrow. Suction equipment changed.

## 2016-05-11 ENCOUNTER — Inpatient Hospital Stay: Payer: Medicare Other

## 2016-05-11 LAB — GLUCOSE, CAPILLARY
GLUCOSE-CAPILLARY: 87 mg/dL (ref 65–99)
GLUCOSE-CAPILLARY: 99 mg/dL (ref 65–99)
Glucose-Capillary: 104 mg/dL — ABNORMAL HIGH (ref 65–99)
Glucose-Capillary: 114 mg/dL — ABNORMAL HIGH (ref 65–99)
Glucose-Capillary: 85 mg/dL (ref 65–99)
Glucose-Capillary: 93 mg/dL (ref 65–99)

## 2016-05-11 LAB — CBC
HEMATOCRIT: 25.4 % — AB (ref 40.0–52.0)
Hemoglobin: 8.6 g/dL — ABNORMAL LOW (ref 13.0–18.0)
MCH: 30.1 pg (ref 26.0–34.0)
MCHC: 33.6 g/dL (ref 32.0–36.0)
MCV: 89.5 fL (ref 80.0–100.0)
PLATELETS: 140 10*3/uL — AB (ref 150–440)
RBC: 2.84 MIL/uL — AB (ref 4.40–5.90)
RDW: 14.9 % — AB (ref 11.5–14.5)
WBC: 24.2 10*3/uL — AB (ref 3.8–10.6)

## 2016-05-11 LAB — BASIC METABOLIC PANEL
Anion gap: 11 (ref 5–15)
BUN: 35 mg/dL — AB (ref 6–20)
CO2: 30 mmol/L (ref 22–32)
Calcium: 6 mg/dL — CL (ref 8.9–10.3)
Chloride: 95 mmol/L — ABNORMAL LOW (ref 101–111)
Creatinine, Ser: 1.88 mg/dL — ABNORMAL HIGH (ref 0.61–1.24)
GFR, EST AFRICAN AMERICAN: 39 mL/min — AB (ref >60)
GFR, EST NON AFRICAN AMERICAN: 34 mL/min — AB (ref >60)
Glucose, Bld: 101 mg/dL — ABNORMAL HIGH (ref 65–99)
POTASSIUM: 2.9 mmol/L — AB (ref 3.5–5.1)
SODIUM: 136 mmol/L (ref 135–145)

## 2016-05-11 LAB — PHOSPHORUS: PHOSPHORUS: 3.8 mg/dL (ref 2.5–4.6)

## 2016-05-11 LAB — MAGNESIUM: MAGNESIUM: 1.6 mg/dL — AB (ref 1.7–2.4)

## 2016-05-11 MED ORDER — DEXTROSE 5 % IV SOLN
2.0000 g | INTRAVENOUS | Status: DC
  Administered 2016-05-11: 2 g via INTRAVENOUS
  Filled 2016-05-11: qty 2

## 2016-05-11 MED ORDER — POTASSIUM CHLORIDE CRYS ER 20 MEQ PO TBCR: 40.0000 meq | EXTENDED_RELEASE_TABLET | Freq: Once | ORAL | Status: DC

## 2016-05-11 MED ORDER — POTASSIUM CHLORIDE 20 MEQ PO PACK
40.0000 meq | PACK | Freq: Once | ORAL | Status: AC
  Administered 2016-05-11: 40 meq via ORAL
  Filled 2016-05-11: qty 2

## 2016-05-11 MED ORDER — DEXTROSE 5 % IV SOLN
2.0000 g | INTRAVENOUS | Status: DC
  Administered 2016-05-12: 2 g via INTRAVENOUS
  Filled 2016-05-11: qty 2

## 2016-05-11 MED ORDER — FAMOTIDINE 20 MG PO TABS
20.0000 mg | ORAL_TABLET | Freq: Two times a day (BID) | ORAL | Status: DC
  Administered 2016-05-11 – 2016-05-13 (×4): 20 mg via ORAL
  Filled 2016-05-11 (×5): qty 1

## 2016-05-11 MED ORDER — MAGNESIUM SULFATE 4 GM/100ML IV SOLN
4.0000 g | Freq: Once | INTRAVENOUS | Status: AC
Start: 2016-05-11 — End: 2016-05-11
  Administered 2016-05-11: 4 g via INTRAVENOUS
  Filled 2016-05-11: qty 100

## 2016-05-11 MED ORDER — POTASSIUM CHLORIDE 10 MEQ/50ML IV SOLN
10.0000 meq | INTRAVENOUS | Status: AC
  Administered 2016-05-11 (×4): 10 meq via INTRAVENOUS
  Filled 2016-05-11 (×4): qty 50

## 2016-05-11 MED ORDER — VITAL HIGH PROTEIN PO LIQD
1000.0000 mL | ORAL | Status: DC
  Administered 2016-05-11: 1000 mL

## 2016-05-11 NOTE — Progress Notes (Addendum)
Nutrition Follow-up  DOCUMENTATION CODES:   Not applicable  INTERVENTION:  -TF: discussed nutritional poc during ICU rounds with MD Kasa; received verbal order to start trophic feedings at rate of 10 ml/hr, no orders for titration; orders placed. Brittney RN plans to replace OG tube as current tube not flushing, TF to start after tube replaced and placement confirmed   NUTRITION DIAGNOSIS:   Inadequate oral intake related to acute illness as evidenced by NPO status.  Being addressed as restarting TF  GOAL:   Provide needs based on ASPEN/SCCM guidelines  MONITOR:   TF tolerance, Vent status, Labs, Weight trends, I & O's  REASON FOR ASSESSMENT:   Ventilator, Consult Enteral/tube feeding initiation and management  ASSESSMENT:   75 yo male admitted with abdominal pain, septic shock with UTI, vent dependent respiratory failure. Pt with hx of cognitive communication deficit, bed bound at baseline  Pt off pressors, remains on vent, failed spontaneous weaning trial this AM. Per MD, pt DNR, family would not to pursue trach and PEG, further poc to be determined  Diet Order:   NPO, inadequate nutrition >/= 4 days  Skin:  Reviewed, no issues  Last BM:  05/11/16 liquid stool via rectal tube   Labs: potassium 2.9 (supplemented), corrected calcium 7.5 (supplemented), albumin 2.1  Meds: sodium acetate at 125 ml/hr  Height:   Ht Readings from Last 1 Encounters:  05/09/16 5\' 11"  (1.803 m)    Weight:   Wt Readings from Last 1 Encounters:  05/11/16 237 lb 3.4 oz (107.6 kg)    BMI:  Body mass index is 33.1 kg/(m^2).  Estimated Nutritional Needs:   Kcal:  [id number] kcals   Protein:  >/= 135 g of protein  Fluid:  >/= 2 L  EDUCATION NEEDS:   No education needs identified at this time  Nathan StarcherCate Corney Knighton MS, RD, LDN [phone] Pager  [phone] Weekend/On-Call Pager

## 2016-05-11 NOTE — Progress Notes (Signed)
Patient resting comfortably and back on full vent support.

## 2016-05-11 NOTE — Progress Notes (Signed)
RN spoke with Dr. Wyatt PortelaSomner and asked MD to verify OG tube placement on xray.  RN also made MD aware about xray reading of mild ileus.  Dr. Wyatt PortelaSomner stated "it does show a mild ileus but I think you can go ahead with the trophic feeds."

## 2016-05-11 NOTE — Progress Notes (Signed)
Patient not available and family in conference with Dr. Stann MainlandWill do follow up.

## 2016-05-11 NOTE — Progress Notes (Signed)
Notified Dr. Dema SeverinMungal of K 2.9 and Calcium of 6.0. New orders placed, will reassess.

## 2016-05-11 NOTE — Progress Notes (Signed)
PULMONARY / CRITICAL CARE MEDICINE   Name: Rockie NeighboursRalph L Holub MRN: [id number] DOB: 09/08/1941    ADMISSION DATE:  05/08/2016   REFERRING MD:  ED  CHIEF COMPLAINT:  AMS, hypotension and abdominal pain  HISTORY OF PRESENT ILLNESS:   This is a 75 yo caucasian male with a PMH of frequent UTIs, BPH, hypertension, anemia, bronchitis and paroxysmal tachycardia who presented to the ED with decreased level of consciousness, and abdominal pain. At the ED he was found to be hypotensive with MAPs in the low 50s. His UA was abnormal and a CT abdomen showed two adjacent stones lying in the mid to distal right ureter, largest measuring 6 mm, causing mild right hydroureteronephrosis. He was taken to the OR for a cystoscopy and a right ureteral stent placement. Postop, he remained hypotensive despite appetite, fluid resuscitation. He is currently intubated and sedated.  Patient was recently hospitalized from 04/15/2016 to 04/20/2016 and treated for urosepsis/UTI, non-ST elevation MI and AKI.  SUBJECTIVE: remains critically ill, severe acidosis and shock, resp failure Off vasopressors today. Distended ABD today-hold TF's for now  MRSA screen positive. Increased HR l;ast night, given pain meds Plan for SAT/SBT today when family arrives   VITAL SIGNS: BP 112/81 mmHg  Pulse 77  Temp(Src) 98.3 F (36.8 C) (Axillary)  Resp 15  Ht 5\' 11"  (1.803 m)  Wt 237 lb 3.4 oz (107.6 kg)  BMI 33.10 kg/m2  SpO2 93%  HEMODYNAMICS: CVP:  [13 mmHg-14 mmHg] 14 mmHg  VENTILATOR SETTINGS: Vent Mode:  [-] PRVC FiO2 (%):  [35 %-40 %] 40 % Set Rate:  [15 bmp] 15 bmp Vt Set:  [500 mL] 500 mL PEEP:  [5 cmH20] 5 cmH20 Plateau Pressure:  [9 cmH20-20 cmH20] 20 cmH20  INTAKE / OUTPUT: I/O last 3 completed shifts: In: 6355.2 [I.V.:6005.2; IV Piggyback:350] Out: 3305 [Urine:2930; Emesis/NG output:100; Stool:275]  PHYSICAL EXAMINATION: General: Acutely ill-looking  Neuro: Sedated; awakens and withdraws to noxious  stimulus, gcs<8T HEENT:  Normocephalic and atraumatic, PERRLA, oral mucosa pink, ET/OG tubes in place Cardiovascular: Rate and rhythm regular, S1, S2 audible, no murmur, regurg or gallop Lungs: Bilateral airflow, positive Rales and rhonchi in anterior  lung fields, no wheezing Abdomen: Mildly distended, Soft, nontender, normal bowel sounds Musculoskeletal:  No visible joint deformities. Extremities: +2 pulses bilaterally, no edema Skin:  Warm and dry, no discolorations  LABS:  BMET  Recent Labs Lab 05/09/16 0422 05/10/16 0842 05/11/16 0606  NA 136 133* 136  K 5.1 4.2 2.9*  CL 111 98* 95*  CO2 19* 24 30  BUN 39* 36* 35*  CREATININE 2.36* 1.96* 1.88*  GLUCOSE 148* 113* 101*    Electrolytes  Recent Labs Lab 05/13/2016 1925 05/08/16 0520 05/09/16 0422 05/10/16 0842 05/11/16 0606  CALCIUM 7.2* 6.7* 6.1* 6.0* 6.0*  MG 2.6* 2.3  --   --   --   PHOS 3.5 4.1  --   --   --     CBC  Recent Labs Lab 05/09/16 0422 05/10/16 0842 05/11/16 0606  WBC 58.9* 50.2* 24.2*  HGB 9.2* 9.1* 8.6*  HCT 29.6* 27.4* 25.4*  PLT 245 179 140*    Coag's  Recent Labs Lab 04/25/2016 1349 05/08/16 0520  APTT 28  --   INR 1.11 1.31    Sepsis Markers  Recent Labs Lab 04/23/2016 1925 05/08/16 0442 05/08/16 0520 05/10/16 0848  LATICACIDVEN 2.1* 1.9  --  1.8  PROCALCITON  --   --  30.10  --     ABG  Recent Labs Lab 05/08/16 0627 05/09/16 0746 05/10/16 0732  PHART 7.24* 7.15* 7.32*  PCO2ART 39 37 47  PO2ART 117* 82* 60*    Liver Enzymes  Recent Labs Lab 05/04/2016 1349 05/08/16 0520 05/10/16 0842  AST 33 26 14*  ALT 16* 12* 10*  ALKPHOS 118 80 75  BILITOT 1.8* 1.3* 0.6  ALBUMIN 3.6 2.3* 2.1*    Cardiac Enzymes  Recent Labs Lab 04/20/2016 1925 05/08/16 0054 05/08/16 0657  TROPONINI 0.03 <0.03 0.03    Glucose  Recent Labs Lab 05/10/16 1140 05/10/16 1606 05/10/16 1932 05/11/16 0037 05/11/16 0328 05/11/16 0730  GLUCAP 108* 141* 83 114* 104* 93     Imaging No results found.  STUDIES:  Last 2-D echo was 04/16/2016 and showed an EF of 35%  CULTURES: Blood cultures 2. Urine culture MRSA screen positive  ANTIBIOTICS: Vancomycin 04/25/2016. Zosyn 05/15/2016  SIGNIFICANT EVENTS: 05/17/2016: ED with altered mental status, hypotension, and abdominal pain, taken to the OR for a right ureteral stent placement  LINES/TUBES: Left IJ 04/20/2016 ET tube 05/17/2016  DISCUSSION: 75 year old male presenting with right renal calculus, UTI, and sepsis; now status post cystoscopy with right ureteral stent placement and in septic shock and acute resp failure  ASSESSMENT / PLAN:  PULMONARY A: Vent dependent respiratory failure due to sepsis P:   -Full vent support with current settings-FiO2 40%, PEEP of 5, rate of 15 and tidal volume of 500. -cxr/abg as needed -plan for SAT/SBt today when family at bedside  CARDIOVASCULAR A:  Septic shock-Remains on 3 pressors History of hypertension P:  -Monitor hemodynamics per ICU protocol -titrate to maintain mean arterial blood pressure greater than 65 -Continue IV fluid boluses for CVP readings less than 12 -stress dose steroids  RENAL A:   Acute renal failure secondary to obstructive uropathy/Rt ureteral calculus Hyperkalemia  P:   -IV fluids -Foley K per protocol -Monitor I's and O's. -Renally dose medications. -Avoid nephrotoxic medications  GASTROINTESTINAL A:   Abdominal distention P:   -NG to intermittent suction, hold TF's for now -Pepcid for stress ulcer prophylaxis  HEMATOLOGIC A:   Anemia-blood loss versus chronic disease P:  -Monitor CBC and transfuse when necessary  INFECTIOUS A:   Sepsis of urinary source Severe leukocytosis P:   -Follow-up cultures -Broad-spectrum antibiotics as above  ENDOCRINE A:   Steroid-induced hyperglycemia without diabetes P:   -Point-of-care blood glucose monitoring every 4 hours with Sliding-scale insulin  coverage  NEUROLOGIC A:   Acute metabolic and encephalopathy secondary to sepsis Vent associated discomfort P:   RASS goal: -1 to -2 -Fentanyl and Versed for vent sedation and comfort -Wake assessment every morning     Best Practice: Code Status: DNR GI prophylaxis: H2 blocker. VTE prophylaxis:  SCD's / heparin.     The Patient requires high complexity decision making for assessment and support, frequent evaluation and titration of therapies, application of advanced monitoring technologies and extensive interpretation of multiple databases. Critical Care Time devoted to patient care services described in this note is 40 minutes.   Overall, patient is critically ill, prognosis is guarded.  Patient with Multiorgan failure and at high risk for cardiac arrest and death.   Awaiting for family to arrive for SAT/SBT  Lucie LeatherKurian David Keiyon Plack, M.D.  [facility]  Medical Director [facility] Medical Director [facility]

## 2016-05-11 NOTE — Consult Note (Signed)
Pharmacy Antibiotic Note  Nathan NeighboursRalph L Figueroa is a 75 y.o. male admitted on 05/06/2016 with sepsis.  Pharmacy had been consulted for vancomycin/zosyn dosing. Vancomycin d/c 6/20, Zosyn d/c 6/22.  Plan: Patient transitioned to ceftriaxone 2g IV Q24hr. Plan is for patient to complete antibiotic course after dose on 6/24.     Height: 5\' 11"  (180.3 cm) Weight: 237 lb 3.4 oz (107.6 kg) IBW/kg (Calculated) : 75.3  Temp (24hrs), Avg:98.5 F (36.9 C), Min:98.3 F (36.8 C), Max:98.9 F (37.2 C)   Recent Labs Lab 04/24/2016 1349 04/28/2016 1925 05/08/16 0102 05/08/16 0442 05/08/16 0520 05/08/16 0657 05/09/16 0422 05/10/16 0842 05/10/16 0848 05/11/16 0606  WBC 38.0*  --  75.5*  --   --  68.4* 58.9* 50.2*  --  24.2*  CREATININE 3.16* 2.99*  --   --  2.59*  --  2.36* 1.96*  --  1.88*  LATICACIDVEN 2.7* 2.1*  --  1.9  --   --   --   --  1.8  --     Estimated Creatinine Clearance: 43.7 mL/min (by C-G formula based on Cr of 1.88).    No Known Allergies  Antimicrobials this admission: vancomycin 6/18 >> 6/20 zosyn 6/18 >> 6/22 Ceftriaxone 6/22 >> 6/24     Microbiology results: 6/19 C diff: NG 6/18 BCx: NGTD 6/18 UCx: NG 6/18 MRSA PCR: positive  Pharmacy will continue to monitor and adjust per consult.    Nathan SilversMichael Silvia Figueroa   05/11/2016 2:36 PM

## 2016-05-11 NOTE — Progress Notes (Signed)
eLink Physician-Brief Progress Note Patient Name: Nathan Figueroa DOB: 12/12/1940 MRN: [id number]   Date of Service  05/11/2016  HPI/Events of Note  K=2.9  eICU Interventions  replaced     Intervention Category Major Interventions: Electrolyte abnormality - evaluation and management  Maxmilian Trostel 05/11/2016, 6:55 AM

## 2016-05-11 NOTE — Progress Notes (Signed)
Pt sustained rate 140's, notified Dr. Dema SeverinMungal @ [facility]. Orders for a fluid bolus, calcium gluconate and fentanyl bolus. Will continue to reassess.

## 2016-05-11 NOTE — Progress Notes (Signed)
RN made Dr. Belia HemanKasa aware that OG tube is occluded.  MD gave order to remove and replace tube.

## 2016-05-11 NOTE — Progress Notes (Signed)
eLink Physician-Brief Progress Note Patient Name: Nathan Figueroa DOB: 01/02/1941 MRN: [id number]   Date of Service  05/11/2016  HPI/Events of Note  Morning labs ordered  eICU Interventions  BMP, CBC     Intervention Category Intermediate Interventions: Other:  Aneliz Carbary 05/11/2016, 5:44 AM

## 2016-05-11 NOTE — Progress Notes (Signed)
MEDICATION RELATED CONSULT NOTE   Pharmacy Consult for electrolyte management  No Known Allergies  Patient Measurements: Height: 5\' 11"  (180.3 cm) Weight: 237 lb 3.4 oz (107.6 kg) IBW/kg (Calculated) : 75.3  Vital Signs: Temp: 98.7 F (37.1 C) (06/22 1146) Temp Source: Oral (06/22 1146) BP: 93/58 mmHg (06/22 1300) Pulse Rate: 81 (06/22 1300) Intake/Output from previous day: 06/21 0701 - 06/22 0700 In: 3847.2 [I.V.:3647.2; IV Piggyback:200] Out: 2005 [Urine:1730; Emesis/NG output:100; Stool:175] Intake/Output from this shift: Total I/O In: 1300 [I.V.:700; IV Piggyback:600] Out: 750 [Urine:750]  Labs:  Recent Labs  05/09/16 0422 05/10/16 0842 05/11/16 0606 05/11/16 0906  WBC 58.9* 50.2* 24.2*  --   HGB 9.2* 9.1* 8.6*  --   HCT 29.6* 27.4* 25.4*  --   PLT 245 179 140*  --   CREATININE 2.36* 1.96* 1.88*  --   MG  --   --   --  1.6*  PHOS  --   --   --  3.8  ALBUMIN  --  2.1*  --   --   PROT  --  6.0*  --   --   AST  --  14*  --   --   ALT  --  10*  --   --   ALKPHOS  --  75  --   --   BILITOT  --  0.6  --   --    Estimated Creatinine Clearance: 43.7 mL/min (by C-G formula based on Cr of 1.88).   Assessment: Pharmacy consulted for electrolyte management for 75 yo male ICU patient with hypokalemia. Patient ordered potassium 10mEq IV Q1hr x 4 dose and potassium 40mEq VT x 1.     Plan:  Will replace magnesium 2g for goal magnesium >/=2.   Will recheck all electrolytes with am labs.   Pharmacy will continue to monitor and adjust per consult.   Nathan Figueroa 05/11/2016,2:38 PM

## 2016-05-11 NOTE — Progress Notes (Signed)
Patient very diapharetic and slightly tachycardic rate 110. Increased work of breathing noted.  Patient agitated and localizing pain but not following commands.  Patient's daughter spoke with this nurse and expressed that she did not want to see her father suffering and she wanted to talk with the doctor about palliative care.  RN made Dr. Belia HemanKasa aware and MD came to speak with family.  MD instructed this RN to restart fentanyl drip and to give PRN versed.  RN restarted fentanyl drip and gave bolus of fentanyl and PRN versed.  Continuing to monitor.

## 2016-05-11 NOTE — Progress Notes (Signed)
OG tube replaced per Dr. Clovis FredricksonKasa's order.

## 2016-05-11 NOTE — Care Management (Signed)
CHF with EF 35%. Off Pressors. Attempted vent weaning and failed. Plan is to try to wean vent again 06/23 if fails then comfort care will be discussed.

## 2016-05-11 NOTE — Progress Notes (Signed)
Amy, RT placed patient in spontaneous mode. Family at bedside. Patient has safety mittens on and does reach up with right hand.

## 2016-05-12 ENCOUNTER — Encounter: Payer: Self-pay | Admitting: Adult Health

## 2016-05-12 LAB — BASIC METABOLIC PANEL
ANION GAP: 10 (ref 5–15)
BUN: 32 mg/dL — ABNORMAL HIGH (ref 6–20)
CALCIUM: 6 mg/dL — AB (ref 8.9–10.3)
CO2: 37 mmol/L — AB (ref 22–32)
CREATININE: 1.85 mg/dL — AB (ref 0.61–1.24)
Chloride: 95 mmol/L — ABNORMAL LOW (ref 101–111)
GFR calc Af Amer: 40 mL/min — ABNORMAL LOW (ref >60)
GFR calc non Af Amer: 34 mL/min — ABNORMAL LOW (ref >60)
GLUCOSE: 99 mg/dL (ref 65–99)
Potassium: 2.6 mmol/L — CL (ref 3.5–5.1)
Sodium: 142 mmol/L (ref 135–145)

## 2016-05-12 LAB — CULTURE, BLOOD (ROUTINE X 2)
CULTURE: NO GROWTH
Culture: NO GROWTH

## 2016-05-12 LAB — GLUCOSE, CAPILLARY
GLUCOSE-CAPILLARY: 104 mg/dL — AB (ref 65–99)
GLUCOSE-CAPILLARY: 119 mg/dL — AB (ref 65–99)
Glucose-Capillary: 107 mg/dL — ABNORMAL HIGH (ref 65–99)
Glucose-Capillary: 112 mg/dL — ABNORMAL HIGH (ref 65–99)
Glucose-Capillary: 126 mg/dL — ABNORMAL HIGH (ref 65–99)
Glucose-Capillary: 142 mg/dL — ABNORMAL HIGH (ref 65–99)
Glucose-Capillary: 80 mg/dL (ref 65–99)

## 2016-05-12 LAB — POTASSIUM: POTASSIUM: 2.8 mmol/L — AB (ref 3.5–5.1)

## 2016-05-12 LAB — PHOSPHORUS: Phosphorus: 2.8 mg/dL (ref 2.5–4.6)

## 2016-05-12 LAB — MAGNESIUM: Magnesium: 2.1 mg/dL (ref 1.7–2.4)

## 2016-05-12 LAB — ALBUMIN: ALBUMIN: 2.1 g/dL — AB (ref 3.5–5.0)

## 2016-05-12 MED ORDER — POTASSIUM CHLORIDE 10 MEQ/50ML IV SOLN
10.0000 meq | INTRAVENOUS | Status: AC
Start: 2016-05-12 — End: 2016-05-12
  Administered 2016-05-12 (×3): 10 meq via INTRAVENOUS
  Filled 2016-05-12 (×4): qty 50

## 2016-05-12 MED ORDER — POTASSIUM CHLORIDE 10 MEQ/100ML IV SOLN
10.0000 meq | INTRAVENOUS | Status: AC
  Administered 2016-05-12 (×4): 10 meq via INTRAVENOUS
  Filled 2016-05-12 (×4): qty 100

## 2016-05-12 MED ORDER — VITAL HIGH PROTEIN PO LIQD
1000.0000 mL | ORAL | Status: DC
  Administered 2016-05-12 (×3)
  Administered 2016-05-12: 1000 mL
  Administered 2016-05-12 – 2016-05-13 (×11)

## 2016-05-12 MED ORDER — AMOXICILLIN 500 MG PO CAPS
500.0000 mg | ORAL_CAPSULE | ORAL | Status: DC
  Filled 2016-05-12: qty 1

## 2016-05-12 MED ORDER — DEXTROSE 5 % IV SOLN
2.0000 g | INTRAVENOUS | Status: DC
  Administered 2016-05-12: 2 g via INTRAVENOUS
  Filled 2016-05-12 (×2): qty 2

## 2016-05-12 MED ORDER — ENOXAPARIN SODIUM 40 MG/0.4ML ~~LOC~~ SOLN
40.0000 mg | SUBCUTANEOUS | Status: DC
  Administered 2016-05-12: 40 mg via SUBCUTANEOUS
  Filled 2016-05-12: qty 0.4

## 2016-05-12 MED ORDER — POTASSIUM CHLORIDE 10 MEQ/50ML IV SOLN
10.0000 meq | INTRAVENOUS | Status: DC
  Filled 2016-05-12 (×5): qty 50

## 2016-05-12 MED ORDER — POTASSIUM CHLORIDE 20 MEQ/15ML (10%) PO SOLN
40.0000 meq | Freq: Once | ORAL | Status: AC
  Administered 2016-05-12: 40 meq
  Filled 2016-05-12: qty 30

## 2016-05-12 MED ORDER — PRO-STAT SUGAR FREE PO LIQD
30.0000 mL | ORAL | Status: DC
  Administered 2016-05-12 – 2016-05-13 (×5): 30 mL

## 2016-05-12 MED ORDER — CALCIUM GLUCONATE 10 % IV SOLN
2.0000 g | Freq: Once | INTRAVENOUS | Status: AC
  Administered 2016-05-12: 2 g via INTRAVENOUS
  Filled 2016-05-12: qty 20

## 2016-05-12 NOTE — NC FL2 (Signed)
[place] LEVEL OF CARE SCREENING TOOL     IDENTIFICATION  Patient Name: Nathan Figueroa Birthdate: March 07, 1941 Sex: male Admission Date (Current Location): 05/12/2016  [place] and [place] Number:  Engineering geologist and Address:  [facility], [address] [place], [place] [address]      Provider Number: [id number]  Attending Physician Name and Address:  Wilhelmina Mcardle, MD  Relative Name and Phone Number:       Current Level of Care: Hospital Recommended Level of Care: [place] Prior Approval Number:    Date Approved/Denied:   PASRR Number: [id number]  Discharge Plan: SNF    Current Diagnoses: Patient Active Problem List   Diagnosis Date Noted  . Acute respiratory failure ([place])   . Elevated lactic acid level   . Arterial hypotension   . Septic shock ([place]) 05/08/2016  . Ureteral calculus, right 05/08/2016  . Severe sepsis ([place]) 05/11/2016    Orientation RESPIRATION BLADDER Height & Weight     Self  Vent Indwelling catheter Weight: 232 lb 2.3 oz (105.3 kg) Height:  5' 11"  (180.3 cm)  BEHAVIORAL SYMPTOMS/MOOD NEUROLOGICAL BOWEL NUTRITION STATUS      Incontinent Diet (normal)  AMBULATORY STATUS COMMUNICATION OF NEEDS Skin   Extensive Assist Verbally Normal                       Personal Care Assistance Level of Assistance    Bathing Assistance: Maximum assistance Feeding assistance: Independent Dressing Assistance: Maximum assistance Total Care Assistance: Maximum assistance   Functional Limitations Info    Sight Info: Adequate Hearing Info: Adequate Speech Info: Adequate    SPECIAL CARE FACTORS FREQUENCY        PT Frequency: 5x week              Contractures      Additional Factors Info  Code Status Code Status Info: DNR             Current Medications (05/12/2016):  This is the current hospital active medication list Current Facility-Administered Medications   Medication Dose Route Frequency Provider Last Rate Last Dose  . 0.9 %  sodium chloride infusion  250 mL Intravenous PRN Wilhelmina Mcardle, MD      . acetaminophen (TYLENOL) tablet 650 mg  650 mg Per Tube Q4H PRN Wilhelmina Mcardle, MD      . antiseptic oral rinse solution (CORINZ)  7 mL Mouth Rinse 10 times per day Mikael Spray, NP   7 mL at 05/12/16 0545  . bisacodyl (DULCOLAX) suppository 10 mg  10 mg Rectal Daily PRN Mikael Spray, NP      . cefTRIAXone (ROCEPHIN) 2 g in dextrose 5 % 50 mL IVPB  2 g Intravenous Q24H Wilhelmina Mcardle, MD      . chlorhexidine gluconate (SAGE KIT) (PERIDEX) 0.12 % solution 15 mL  15 mL Mouth Rinse BID Mikael Spray, NP   15 mL at 05/12/16 0804  . Chlorhexidine Gluconate Cloth 2 % PADS 6 each  6 each Topical Q0600 Wilhelmina Mcardle, MD   6 each at 05/12/16 [phone]  . famotidine (PEPCID) tablet 20 mg  20 mg Oral BID Wilhelmina Mcardle, MD   20 mg at 05/11/16 2152  . feeding supplement (PRO-STAT SUGAR FREE 64) liquid 30 mL  30 mL Per Tube BID Wilhelmina Mcardle, MD   30 mL at 05/11/16 2152  . feeding supplement (VITAL  HIGH PROTEIN) liquid 1,000 mL  1,000 mL Per Tube Q24H Flora Lipps, MD   1,000 mL at 05/11/16 1752  . fentaNYL (SUBLIMAZE) bolus via infusion 25 mcg  25 mcg Intravenous Q1H PRN Mikael Spray, NP   25 mcg at 05/11/16 1115  . fentaNYL (SUBLIMAZE) injection 50 mcg  50 mcg Intravenous Q15 min PRN Wilhelmina Mcardle, MD      . fentaNYL (SUBLIMAZE) injection 50 mcg  50 mcg Intravenous Q2H PRN Wilhelmina Mcardle, MD   50 mcg at 05/08/16 2017  . fentaNYL 2537mg in NS 2557m(1037mml) infusion-PREMIX  25-400 mcg/hr Intravenous Continuous MagMikael SprayP   Stopped at 05/12/16 0740  . heparin injection 5,000 Units  5,000 Units Subcutaneous Q8H DavWilhelmina McardleD   5,000 Units at 05/12/16 0545  . insulin aspart (novoLOG) injection 0-20 Units  0-20 Units Subcutaneous Q4H KurFlora LippsD   3 Units at 05/10/16 1612  . midazolam (VERSED) injection 1 mg  1 mg  Intravenous Q1H PRN KurFlora LippsD   1 mg at 05/11/16 1715  . mupirocin ointment (BACTROBAN) 2 % 1 application  1 application Nasal BID DavWilhelmina McardleD   1 application at 05/17/77/6710  . polyvinyl alcohol (LIQUIFILM TEARS) 1.4 % ophthalmic solution 1 drop  1 drop Both Eyes PRN KurFlora LippsD      . potassium chloride 10 mEq in 50 mL *CENTRAL LINE* IVPB  10 mEq Intravenous Q1 Hr x 4 JenJavier GlazierD   10 mEq at 05/12/16 0903  . sennosides (SENOKOT) 8.8 MG/5ML syrup 5 mL  5 mL Per Tube BID PRN MagMikael SprayP      . sodium chloride 0.9 % bolus 500 mL  500 mL Intravenous PRN DavWilhelmina McardleD      . sterile water 1,000 mL with sodium acetate 150 mEq infusion   Intravenous Continuous KurFlora LippsD 125 mL/hr at 05/12/16 [id number]    Discharge Medications: Please see discharge summary for a list of discharge medications.  Relevant Imaging Results:  Relevant Lab Results:   Additional Information SSN [id number] ReamerCSSouth West [place]

## 2016-05-12 NOTE — Progress Notes (Signed)
Patient is having some abdominal breathing and accessory muscle use. Heart rate is elevated and blood pressure. Not following simple commands. Per Dr Belia HemanKasa restarted fentanyl.

## 2016-05-12 NOTE — Progress Notes (Signed)
Per Dr Belia HemanKasa continue to watch blood pressure and heart rate. At this point no additional meds at this time. Plan is to allow patient time to wake up some more. He is not following any simple commands.

## 2016-05-12 NOTE — Progress Notes (Signed)
MEDICATION RELATED CONSULT NOTE   Pharmacy Consult for electrolyte management  No Known Allergies  Patient Measurements: Height: 5\' 11"  (180.3 cm) Weight: 232 lb 2.3 oz (105.3 kg) IBW/kg (Calculated) : 75.3  Vital Signs: Temp: 98 F (36.7 C) (06/23 0800) BP: 117/83 mmHg (06/23 1600) Pulse Rate: 86 (06/23 1600) Intake/Output from previous day: 06/22 0701 - 06/23 0700 In: 3845 [I.V.:3005; NG/GT:240; IV Piggyback:600] Out: 4100 [Urine:4100] Intake/Output from this shift: Total I/O In: -  Out: 750 [Urine:750]  Labs:  Recent Labs  05/10/16 0842 05/11/16 0606 05/11/16 0906 05/12/16 0505  WBC 50.2* 24.2*  --   --   HGB 9.1* 8.6*  --   --   HCT 27.4* 25.4*  --   --   PLT 179 140*  --   --   CREATININE 1.96* 1.88*  --  1.85*  MG  --   --  1.6* 2.1  PHOS  --   --  3.8 2.8  ALBUMIN 2.1*  --   --  2.1*  PROT 6.0*  --   --   --   AST 14*  --   --   --   ALT 10*  --   --   --   ALKPHOS 75  --   --   --   BILITOT 0.6  --   --   --    Estimated Creatinine Clearance: 43.9 mL/min (by C-G formula based on Cr of 1.85).   Assessment: Pharmacy consulted for electrolyte management for 75 yo male ICU patient with hypokalemia. Patient ordered potassium 10mEq IV Q1hr x 4 dose and potassium 40mEq VT x 1.     Plan:  Will replace calcium gluconate 2g.   Will recheck potassium at 1800.   Will recheck all electrolytes with am labs.   Pharmacy will continue to monitor and adjust per consult.   6/23:  K @ 18:17 = 2.8 Will order KCl 10 mEq IV X 4 to start on 6/23 @ 20:00 and recheck K on 6/24 @ 1:00.   Renda Pohlman D 05/12/2016,6:56 PM

## 2016-05-12 NOTE — Progress Notes (Signed)
MEDICATION RELATED CONSULT NOTE   Pharmacy Consult for electrolyte management  No Known Allergies  Patient Measurements: Height: 5\' 11"  (180.3 cm) Weight: 232 lb 2.3 oz (105.3 kg) IBW/kg (Calculated) : 75.3  Vital Signs: Temp: 98.2 F (36.8 C) (06/23 0500) Temp Source: Oral (06/23 0500) BP: 90/63 mmHg (06/23 1400) Pulse Rate: 98 (06/23 1400) Intake/Output from previous day: 06/22 0701 - 06/23 0700 In: 3845 [I.V.:3005; NG/GT:240; IV Piggyback:600] Out: 4100 [Urine:4100] Intake/Output from this shift:    Labs:  Recent Labs  05/10/16 0842 05/11/16 0606 05/11/16 0906 05/12/16 0505  WBC 50.2* 24.2*  --   --   HGB 9.1* 8.6*  --   --   HCT 27.4* 25.4*  --   --   PLT 179 140*  --   --   CREATININE 1.96* 1.88*  --  1.85*  MG  --   --  1.6* 2.1  PHOS  --   --  3.8 2.8  ALBUMIN 2.1*  --   --  2.1*  PROT 6.0*  --   --   --   AST 14*  --   --   --   ALT 10*  --   --   --   ALKPHOS 75  --   --   --   BILITOT 0.6  --   --   --    Estimated Creatinine Clearance: 43.9 mL/min (by C-G formula based on Cr of 1.85).   Assessment: Pharmacy consulted for electrolyte management for 75 yo male ICU patient with hypokalemia. Patient ordered potassium 10mEq IV Q1hr x 4 dose and potassium 40mEq VT x 1.     Plan:  Will replace calcium gluconate 2g.   Will recheck potassium at 1800.   Will recheck all electrolytes with am labs.   Pharmacy will continue to monitor and adjust per consult.   [name],[name] 05/12/2016,2:29 PM

## 2016-05-12 NOTE — Progress Notes (Signed)
Nutrition Follow-up  DOCUMENTATION CODES:   Not applicable  INTERVENTION:  -Discussed nutritional poc with MD Kasa during ICU rounds; MD wanting to continue trophic feedings for today but agreeable to increasing rate of 20 ml/hr. Recommend also increasing Prostat supplementation to 6 packets daily to maximize protein intake. If continues to tolerate trophic feedings, recommend goal rate of 40 ml/hr with Prostat QID providing 1360 kcals, 145 g of protein and 806 mL of free water. Continue to assess   NUTRITION DIAGNOSIS:   Inadequate oral intake related to acute illness as evidenced by NPO status.  Being addressed via TF  GOAL:   Provide needs based on ASPEN/SCCM guidelines  MONITOR:   TF tolerance, Vent status, Labs, Weight trends, I & O's  REASON FOR ASSESSMENT:   Ventilator, Consult Enteral/tube feeding initiation and management  ASSESSMENT:   75 yo male admitted with abdominal pain, septic shock with UTI, vent dependent respiratory failure. Pt with hx of cognitive communication deficit, bed bound at baseline  Pt failed weaning trial this AM, remains sedated on vent Pt tolerating Vital High Protein at rate of 10 ml/hr  Diet Order:   NPO  Skin:  Reviewed, no issues  Last BM:  05/11/16 liquid stool via rectal tube  Height:   Ht Readings from Last 1 Encounters:  05/09/16 5\' 11"  (1.803 m)    Weight:   Wt Readings from Last 1 Encounters:  05/12/16 232 lb 2.3 oz (105.3 kg)    Filed Weights   05/10/16 0500 05/11/16 0500 05/12/16 0604  Weight: 228 lb 13.4 oz (103.8 kg) 237 lb 3.4 oz (107.6 kg) 232 lb 2.3 oz (105.3 kg)    BMI:  Body mass index is 32.39 kg/(m^2).  Estimated Nutritional Needs:   Kcal:  [id number] kcals   Protein:  >/= 135 g of protein  Fluid:  >/= 2 L  EDUCATION NEEDS:   No education needs identified at this time  Romelle StarcherCate Shanekia Latella MS, RD, LDN [phone] Pager  [phone] Weekend/On-Call Pager

## 2016-05-12 NOTE — Progress Notes (Signed)
Stopped tube feeds for possible extubation today

## 2016-05-12 NOTE — Progress Notes (Signed)
[facility]Link Physician-Brief Progress Note Patient Name: Nathan NeighboursRalph L Cornette DOB: 09/07/1941 MRN: [id number]   Date of Service  05/12/2016  HPI/Events of Note  Hypokalemia 2.6. Creatinine 1.85. Central venous access.   eICU Interventions  1. KCl 10 mg IV 4 runs 2. KCl 40 mg VT x1     Intervention Category Intermediate Interventions: Electrolyte abnormality - evaluation and management  Lawanda CousinsJennings Colleen Kotlarz 05/12/2016, 6:23 AM

## 2016-05-12 NOTE — Care Management (Signed)
Failed SBT. Will retry tomorrow. Fentanyl restarted.

## 2016-05-12 NOTE — Clinical Social Work Note (Addendum)
Clinical Social Work Assessment  Patient Details  Name: Nathan Figueroa MRN: [id number] Date of Birth: 11-26-1940  Date of referral:  05/12/16               Reason for consult:  Facility Placement                Permission sought to share information with:  Family Supports, Customer service manager Permission granted to share information::  Yes, Verbal Permission Granted  Name::   Nathan Figueroa [phone] Nathan Figueroa [phone], Nathan Figueroa  ( sister)( HCPOA)   Agency::  [facility]  Relationship::  yes  Contact Information:  yes  Housing/Transportation Living arrangements for the past 2 months:  [place] of Information:  Adult Children Patient Interpreter Needed:  None Criminal Activity/Legal Involvement Pertinent to Current Situation/Hospitalization:  No - Comment as needed Significant Relationships:  Adult Children Lives with:  Facility Resident Do you feel safe going back to the place where you live?  Yes Need for family participation in patient care:  Yes (Comment)  Care giving concerns: LCSW met with patients adult children. They are meeting today to see how patient responds to being removed from Vent they expressed they would like comfort care measures at this time and indicated their is DNR. If recovery possible patient will return to [facility].   Social Worker assessment / plan: LCSW met with patients adult children and received verbal consent from Nathan ( from the family) HCPOA Nathan Figueroa  both Nathan Figueroa ( pts children) can assist in pts plan of care. Patient has dementia with fluctuating orientation. Patient has resided at [facility] for 4 years. He needs full assistance with ADL's with the exeption of feeding himself. Patient has been in a wheel chair for 2 years and not ambulated since. He is incontinent. Pt has medicare and medicaid.  Employment status:  Retired Forensic scientist:  Information systems manager, Medicaid  In [place] PT Recommendations:  Not assessed at this time [place] / Referral to community resources:  [place]  Patient/Family's Response to care:  Family has a good understanding that patient is critically ill and comfort care is requested Patient/Family's Understanding of and Emotional Response to Diagnosis, Current Treatment, and Prognosis: Patients family very supportive of patients care and family members, DNR was stressed and comfort care to be applied.They understand they will be removing sedation and intubation today and assess patients needs, If he recovers or improved its the family plan for patient to return to [facility].  Emotional Assessment Appearance:  Appears stated age Attitude/Demeanor/Rapport:  Unable to Assess Affect (typically observed):  Unable to Assess (patient intubated and sedated) Orientation:  Oriented to Self, Fluctuating Orientation (Suspected and/or reported [place]) (Dementia) Alcohol / Substance use:  Never Used Psych involvement (Current and /or in the community):  No (Comment)  Discharge Needs  Concerns to be addressed:  Decision making concerns, Care Coordination Readmission within the last 30 days:  No Current discharge risk:  None Barriers to Discharge:  No Barriers Identified   Joana Reamer, LCSW 05/12/2016, 9:33 AM

## 2016-05-12 NOTE — Progress Notes (Signed)
[facility]OC[facility] FOR AM LAB REPLACEMENT ONLY  The patient does not apply for the [facility] Electrolyte Replacment Protocol based on the criteria listed below:    Is GFR >/= 40 ml/min? No.  Patient's GFR today is 34   Abnormal electrolyte(s): K2.6  If a panic level lab has been reported, has the CCM MD in charge been notified? Yes.  .   Physician:  Celene SkeenNester MD  [place] NakayamaCh[place], [place] 05/12/2016 6:20 AM

## 2016-05-12 NOTE — Progress Notes (Signed)
PULMONARY / CRITICAL CARE MEDICINE   Name: Oluwadamilare L Hodapp MRN:Rockie Neighbours [id number] DOB: 08/04/1941    ADMISSION DATE:  04/25/2016   REFERRING MD:  ED  CHIEF COMPLAINT:  AMS, hypotension and abdominal pain  HISTORY OF PRESENT ILLNESS:   This is a 75 yo caucasian male with a PMH of frequent UTIs, BPH, hypertension, anemia, bronchitis and paroxysmal tachycardia who presented to the ED with decreased level of consciousness, and abdominal pain. At the ED he was found to be hypotensive with MAPs in the low 50s. His UA was abnormal and a CT abdomen showed two adjacent stones lying in the mid to distal right ureter, largest measuring 6 mm, causing mild right hydroureteronephrosis. He was taken to the OR for a cystoscopy and a right ureteral stent placement. Postop, he remained hypotensive despite appetite, fluid resuscitation. He is currently intubated and sedated.  Patient was recently hospitalized from 04/15/2016 to 04/20/2016 and treated for urosepsis/UTI, non-ST elevation MI and AKI.  SUBJECTIVE: remains critically ill, severe acidosis and shock, resp failure Off vasopressors today. Started trikle feeds yesterday, failed SAt/SBT due to resp muscle fatigue ECHO in 03/2016 EF 35%  MRSA screen positive.  Plan for SAT/SBT again today when family arrives   VITAL SIGNS: BP 137/88 mmHg  Pulse 124  Temp(Src) 98.2 F (36.8 C) (Oral)  Resp 19  Ht 5\' 11"  (1.803 m)  Wt 232 lb 2.3 oz (105.3 kg)  BMI 32.39 kg/m2  SpO2 96%  HEMODYNAMICS:    VENTILATOR SETTINGS: Vent Mode:  [-] PRVC FiO2 (%):  [40 %] 40 % Set Rate:  [15 bmp] 15 bmp Vt Set:  [500 mL] 500 mL PEEP:  [5 cmH20] 5 cmH20 Plateau Pressure:  [20 cmH20] 20 cmH20  INTAKE / OUTPUT: I/O last 3 completed shifts: In: 6047.3 [I.V.:5007.3; NG/GT:240; IV Piggyback:800] Out: 5050 [Urine:5050]  PHYSICAL EXAMINATION: General: Acutely ill-looking  Neuro: Sedated; awakens and withdraws to noxious stimulus, gcs<8T HEENT:  Normocephalic and  atraumatic, PERRLA, oral mucosa pink, ET/OG tubes in place Cardiovascular: Rate and rhythm regular, S1, S2 audible, no murmur, regurg or gallop Lungs: Bilateral airflow, positive Rales and rhonchi in anterior  lung fields, no wheezing Abdomen: Mildly distended, Soft, nontender, normal bowel sounds Musculoskeletal:  No visible joint deformities. Extremities: +2 pulses bilaterally, no edema Skin:  Warm and dry, no discolorations  LABS:  BMET  Recent Labs Lab 05/10/16 0842 05/11/16 0606 05/12/16 0505  NA 133* 136 142  K 4.2 2.9* 2.6*  CL 98* 95* 95*  CO2 24 30 37*  BUN 36* 35* 32*  CREATININE 1.96* 1.88* 1.85*  GLUCOSE 113* 101* 99    Electrolytes  Recent Labs Lab 05/08/16 0520  05/10/16 0842 05/11/16 0606 05/11/16 0906 05/12/16 0505  CALCIUM 6.7*  < > 6.0* 6.0*  --  6.0*  MG 2.3  --   --   --  1.6* 2.1  PHOS 4.1  --   --   --  3.8 2.8  < > = values in this interval not displayed.  CBC  Recent Labs Lab 05/09/16 0422 05/10/16 0842 05/11/16 0606  WBC 58.9* 50.2* 24.2*  HGB 9.2* 9.1* 8.6*  HCT 29.6* 27.4* 25.4*  PLT 245 179 140*    Coag's  Recent Labs Lab 04/25/2016 1349 05/08/16 0520  APTT 28  --   INR 1.11 1.31    Sepsis Markers  Recent Labs Lab 05/16/2016 1925 05/08/16 0442 05/08/16 0520 05/10/16 0848  LATICACIDVEN 2.1* 1.9  --  1.8  PROCALCITON  --   --  30.10  --     ABG  Recent Labs Lab 05/08/16 0627 05/09/16 0746 05/10/16 0732  PHART 7.24* 7.15* 7.32*  PCO2ART 39 37 47  PO2ART 117* 82* 60*    Liver Enzymes  Recent Labs Lab 05-08-2016 1349 05/08/16 0520 05/10/16 0842  AST 33 26 14*  ALT 16* 12* 10*  ALKPHOS 118 80 75  BILITOT 1.8* 1.3* 0.6  ALBUMIN 3.6 2.3* 2.1*    Cardiac Enzymes  Recent Labs Lab 05-08-16 1925 05/08/16 0054 05/08/16 0657  TROPONINI 0.03 <0.03 0.03    Glucose  Recent Labs Lab 05/11/16 0730 05/11/16 1128 05/11/16 1532 05/11/16 1954 05/12/16 0027 05/12/16 0415  GLUCAP 93 87 85 99 107*  104*    Imaging Dg Abd Portable 1v  05/11/2016  CLINICAL DATA:  Enteric tube placement EXAM: PORTABLE ABDOMEN - 1 VIEW COMPARISON:  05-08-2016 abdominal radiograph FINDINGS: Enteric tube terminates in the distal stomach. Surgical clips are seen in the right upper quadrant of the abdomen. Partially visualized right nephro ureteral stent with the proximal pigtail portion overlying the expected location of the right renal pelvis. Patchy bibasilar lung opacities. Mild gaseous distention of bowel loops in the visualized upper abdomen, probably colonic. IMPRESSION: 1. Enteric tube terminates in the distal stomach. 2. Mild gaseous distention of bowel loops in the visualized upper abdomen, favor colonic, probably a mild adynamic ileus. 3. Nonspecific patchy bibasilar lung opacities, correlate with chest radiograph as clinically warranted. Electronically Signed   By: Delbert Phenix M.D.   On: 05/11/2016 13:43    STUDIES:  Last 2-D echo was 04/16/2016 and showed an EF of 35%  CULTURES: Blood cultures 2. Urine culture MRSA screen positive  ANTIBIOTICS: Vancomycin 08-May-2016. Zosyn 08-May-2016-->6/22   SIGNIFICANT EVENTS: 05-08-16: ED with altered mental status, hypotension, and abdominal pain, taken to the OR for a right ureteral stent placement  LINES/TUBES: Left IJ 05/08/16 ET tube May 08, 2016  DISCUSSION: 75 year old male presenting with right renal calculus, UTI, and sepsis; now status post cystoscopy with right ureteral stent placement and in septic shock and acute resp failure  ASSESSMENT / PLAN:  PULMONARY A: Vent dependent respiratory failure due to sepsis P:   -Full vent support with current settings-FiO2 40%, PEEP of 5, rate of 15 and tidal volume of 500. -cxr/abg as needed -plan for SAT/SBt again today when family at bedside  CARDIOVASCULAR A:  Septic shock-Remains on 3 pressors History of hypertension P:  -Monitor hemodynamics per ICU protocol -titrate to maintain mean  arterial blood pressure greater than 65 -Continue IV fluid boluses for CVP readings less than 12 -stress dose steroids stopped  RENAL A:   Acute renal failure secondary to obstructive uropathy/Rt ureteral calculus Hyperkalemia  P:   -IV fluids -Foley K per protocol -Monitor I's and O's. -Renally dose medications. -Avoid nephrotoxic medications  GASTROINTESTINAL A:   Abdominal distention P:   -NG to intermittent suction, hold TF's for now -Pepcid for stress ulcer prophylaxis  HEMATOLOGIC A:   Anemia-blood loss versus chronic disease P:  -Monitor CBC and transfuse when necessary  INFECTIOUS A:   Sepsis of urinary source Severe leukocytosis P:   -Follow-up cultures -Broad-spectrum antibiotics as above  ENDOCRINE A:   Steroid-induced hyperglycemia without diabetes P:   -Point-of-care blood glucose monitoring every 4 hours with Sliding-scale insulin coverage  NEUROLOGIC A:   Acute metabolic and encephalopathy secondary to sepsis Vent associated discomfort P:   RASS goal: -1 to -2 -Fentanyl and Versed for vent sedation and comfort -Wake assessment every morning  Best Practice: Code Status: DNR GI prophylaxis: H2 blocker. VTE prophylaxis:  SCD's / heparin.     The Patient requires high complexity decision making for assessment and support, frequent evaluation and titration of therapies, application of advanced monitoring technologies and extensive interpretation of multiple databases. Critical Care Time devoted to patient care services described in this note is 40 minutes.   Overall, patient is critically ill, prognosis is guarded.  Patient with Multiorgan failure and at high risk for cardiac arrest and death.   Awaiting for family to arrive for SAT/SBT  Lucie LeatherKurian David Sherrica Niehaus, M.D.  [facility]  Medical Director [facility] Medical Director [facility] Cardio-Pulmonary Department

## 2016-05-12 NOTE — Progress Notes (Signed)
Spoke with Dr Belia HemanKasa regarding patients heart rate and blood pressure. He will be coming in to evaluate patient. Family is at patients bedside.

## 2016-05-13 ENCOUNTER — Inpatient Hospital Stay: Payer: Medicare Other

## 2016-05-13 DIAGNOSIS — N200 Calculus of kidney: Secondary | ICD-10-CM

## 2016-05-13 DIAGNOSIS — J9601 Acute respiratory failure with hypoxia: Secondary | ICD-10-CM

## 2016-05-13 LAB — BASIC METABOLIC PANEL
Anion gap: 11 (ref 5–15)
Anion gap: 9 (ref 5–15)
BUN: 33 mg/dL — ABNORMAL HIGH (ref 6–20)
BUN: 38 mg/dL — ABNORMAL HIGH (ref 6–20)
CALCIUM: 6.7 mg/dL — AB (ref 8.9–10.3)
CHLORIDE: 101 mmol/L (ref 101–111)
CHLORIDE: 103 mmol/L (ref 101–111)
CO2: 36 mmol/L — ABNORMAL HIGH (ref 22–32)
CO2: 37 mmol/L — ABNORMAL HIGH (ref 22–32)
CREATININE: 1.95 mg/dL — AB (ref 0.61–1.24)
CREATININE: 1.99 mg/dL — AB (ref 0.61–1.24)
Calcium: 6.5 mg/dL — ABNORMAL LOW (ref 8.9–10.3)
GFR, EST AFRICAN AMERICAN: 38 mL/min — AB (ref >60)
GFR, EST AFRICAN AMERICAN: 37 mL/min — AB (ref >60)
GFR, EST NON AFRICAN AMERICAN: 32 mL/min — AB (ref >60)
GFR, EST NON AFRICAN AMERICAN: 32 mL/min — AB (ref >60)
Glucose, Bld: 111 mg/dL — ABNORMAL HIGH (ref 65–99)
Glucose, Bld: 102 mg/dL — ABNORMAL HIGH (ref 65–99)
POTASSIUM: 3 mmol/L — AB (ref 3.5–5.1)
Potassium: 3.2 mmol/L — ABNORMAL LOW (ref 3.5–5.1)
SODIUM: 148 mmol/L — AB (ref 135–145)
SODIUM: 149 mmol/L — AB (ref 135–145)

## 2016-05-13 LAB — CBC
HCT: 26.6 % — ABNORMAL LOW (ref 40.0–52.0)
HEMOGLOBIN: 8.8 g/dL — AB (ref 13.0–18.0)
MCH: 29.9 pg (ref 26.0–34.0)
MCHC: 33.1 g/dL (ref 32.0–36.0)
MCV: 90.3 fL (ref 80.0–100.0)
PLATELETS: 125 10*3/uL — AB (ref 150–440)
RBC: 2.95 MIL/uL — ABNORMAL LOW (ref 4.40–5.90)
RDW: 15.3 % — ABNORMAL HIGH (ref 11.5–14.5)
WBC: 15.7 10*3/uL — ABNORMAL HIGH (ref 3.8–10.6)

## 2016-05-13 LAB — GLUCOSE, CAPILLARY
GLUCOSE-CAPILLARY: 117 mg/dL — AB (ref 65–99)
GLUCOSE-CAPILLARY: 92 mg/dL (ref 65–99)
Glucose-Capillary: 138 mg/dL — ABNORMAL HIGH (ref 65–99)
Glucose-Capillary: 96 mg/dL (ref 65–99)

## 2016-05-13 LAB — MAGNESIUM: MAGNESIUM: 1.9 mg/dL (ref 1.7–2.4)

## 2016-05-13 LAB — PHOSPHORUS: PHOSPHORUS: 2.5 mg/dL (ref 2.5–4.6)

## 2016-05-13 MED ORDER — POTASSIUM CHLORIDE 10 MEQ/50ML IV SOLN
10.0000 meq | INTRAVENOUS | Status: DC
  Filled 2016-05-13 (×4): qty 50

## 2016-05-13 MED ORDER — MIDAZOLAM HCL 2 MG/2ML IJ SOLN
2.0000 mg | INTRAMUSCULAR | Status: DC | PRN
  Administered 2016-05-13: 2 mg via INTRAVENOUS
  Filled 2016-05-13: qty 2

## 2016-05-13 MED ORDER — SCOPOLAMINE 1 MG/3DAYS TD PT72
1.0000 | MEDICATED_PATCH | TRANSDERMAL | Status: DC
  Administered 2016-05-13: 1.5 mg via TRANSDERMAL
  Filled 2016-05-13: qty 1

## 2016-05-13 MED ORDER — SODIUM CHLORIDE 0.9 % IV SOLN: 10.0000 mg/h | INTRAVENOUS | Status: DC

## 2016-05-13 MED ORDER — SODIUM CHLORIDE 0.9 % IV SOLN
1.0000 g | Freq: Once | INTRAVENOUS | Status: AC
  Administered 2016-05-13: 1 g via INTRAVENOUS
  Filled 2016-05-13: qty 10

## 2016-05-13 MED ORDER — MORPHINE 100MG IN NS 100ML (1MG/ML) PREMIX INFUSION
7.0000 mg/h | INTRAVENOUS | Status: DC
  Administered 2016-05-13: 5 mg/h via INTRAVENOUS
  Administered 2016-05-13: 7 mg/h via INTRAVENOUS
  Filled 2016-05-13 (×2): qty 100

## 2016-05-13 MED ORDER — MAGNESIUM SULFATE IN D5W 1-5 GM/100ML-% IV SOLN
1.0000 g | Freq: Once | INTRAVENOUS | Status: DC
  Filled 2016-05-13: qty 100

## 2016-05-13 MED ORDER — POTASSIUM CHLORIDE 10 MEQ/100ML IV SOLN
10.0000 meq | INTRAVENOUS | Status: DC
  Filled 2016-05-13 (×4): qty 100

## 2016-05-13 MED ORDER — POTASSIUM CHLORIDE 10 MEQ/100ML IV SOLN
10.0000 meq | INTRAVENOUS | Status: AC
  Administered 2016-05-13 (×4): 10 meq via INTRAVENOUS
  Filled 2016-05-13 (×4): qty 100

## 2016-05-13 MED ORDER — POTASSIUM CHLORIDE 20 MEQ/15ML (10%) PO SOLN
20.0000 meq | Freq: Once | ORAL | Status: AC
  Administered 2016-05-13: 20 meq via ORAL
  Filled 2016-05-13: qty 15

## 2016-05-13 NOTE — Plan of Care (Signed)
Problem: Education: Goal: Knowledge of [facility] Education information/materials will improve Outcome: Not Progressing Pt. RASS: 2 to -3, not following commands.  Problem: Safety: Goal: Ability to remain free from injury will improve Outcome: Progressing Pt. On bedrest being turned Q2, no injuries O/N  Problem: Health Behavior/Discharge Planning: Goal: Ability to manage health-related needs will improve Outcome: Not Progressing Pt. Unable to follow commands, some purposeful movement with R hand and arm but unable to manage health needs  Problem: Pain Managment: Goal: General experience of comfort will improve Outcome: Progressing Pt. Given multiple fentanyl boluses O/N as pt. Would become tachycardic and agitated with any intervention.  Also gave versed PRN x 1.  Problem: Physical Regulation: Goal: Ability to maintain clinical measurements within normal limits will improve Outcome: Progressing BP, T, RR, O2sat WDL- HR tachy most of evening with one unsustained instance of 8 beats of [facility]- Ms. Luci Bankukov, NP made aware and an unsustained jump into the 140's as well Goal: Will remain free from infection Outcome: Progressing Pt. Afebrile and WBC's decreased this AM, but still elevated.  Problem: Skin Integrity: Goal: Risk for impaired skin integrity will decrease Outcome: Not Progressing Pt. Still +2 - +3 edema in extremities, swollen excoriated scrotum.  Problem: Fluid Volume: Goal: Ability to maintain a balanced intake and output will improve Outcome: Progressing Pt. UOP 100 mL/h, 30 mL flush w/ TF Q 4  Problem: Nutrition: Goal: Adequate nutrition will be maintained Outcome: Progressing Tolerating trickle feeds @ 20 mL/h & prostat Q4  Problem: Bowel/Gastric: Goal: Will not experience complications related to bowel motility Outcome: Progressing Active to hypoactive BS, small output from flexi seal O/N

## 2016-05-13 NOTE — Progress Notes (Signed)
Update  Spoke with patient's son and daughter Avon Gully(HCPOA).  Discussed case with them and poor prognosis with no significant clinical improvement. Family has discussed amongst themselves the case and poor prognosis, with no significant improvement in quality of life, even if trach and peg were to be performed. Family has decided in the best interest of the patient not to prolong suffering, and has requested withdrawal of care/comfort measures. I am in full agree with their decision.  Chaplain requested per family   RN Witness - Dois DavenportSandra   Withdrawal/Comfort care orders placed.  Stephanie AcreVishal Trinity Haun, MD [place] [facility] Pager [phone] (please enter 7-digits) On Call Pager - [phone] (please enter 7-digits)

## 2016-05-13 NOTE — Progress Notes (Signed)
PULMONARY / CRITICAL CARE MEDICINE   Name: Nathan NeighboursRalph L Figueroa MRN: [id number] DOB: 02/05/1941    ADMISSION DATE:  04/30/2016   REFERRING MD:  ED  CHIEF COMPLAINT:  AMS, hypotension and abdominal pain  HISTORY OF PRESENT ILLNESS:   This is a 75 yo caucasian male with a PMH of frequent UTIs, BPH, hypertension, anemia, bronchitis and paroxysmal tachycardia who presented to the ED with decreased level of consciousness, and abdominal pain. At the ED he was found to be hypotensive with MAPs in the low 50s. His UA was abnormal and a CT abdomen showed two adjacent stones lying in the mid to distal right ureter, largest measuring 6 mm, causing mild right hydroureteronephrosis. He was taken to the OR for a cystoscopy and a right ureteral stent placement. Postop, he remained hypotensive despite appetite, fluid resuscitation. He is currently intubated and sedated.  Patient was recently hospitalized from 04/15/2016 to 04/20/2016 and treated for urosepsis/UTI, non-ST elevation MI and AKI.  SUBJECTIVE: No acute issues overnight. Remains on full vent support. Blood pressure optimal off pressors. WBC and platelets trending down. Improved acidosis.    VITAL SIGNS: BP 133/84 mmHg  Pulse 93  Temp(Src) 98.4 F (36.9 C) (Axillary)  Resp 15  Ht 5\' 11"  (1.803 m)  Wt 222 lb 0.1 oz (100.7 kg)  BMI 30.98 kg/m2  SpO2 94%  HEMODYNAMICS:    VENTILATOR SETTINGS: Vent Mode:  [-] PRVC FiO2 (%):  [35 %-55 %] 35 % Set Rate:  [15 bmp] 15 bmp Vt Set:  [500 mL] 500 mL PEEP:  [5 cmH20] 5 cmH20  INTAKE / OUTPUT: I/O last 3 completed shifts: In: 4162.7 [I.V.:3117.7; NG/GT:445; IV Piggyback:600] Out: 6050 [Urine:5750; Stool:300]  PHYSICAL EXAMINATION: General: Acutely ill-looking  Neuro: Sedated; withdraws to noxious stimulus, gcs<8T HEENT:  Normocephalic and atraumatic, PERRLA, oral mucosa pink, ET/OG tubes in place Cardiovascular: Rate and rhythm regular, S1, S2 audible, no murmur, regurg or gallop Lungs:  Bilateral airflow, positive Rales and rhonchi in anterior  lung fields, no wheezing Abdomen: non-distended, soft, normal bowel sounds Musculoskeletal:  No visible joint deformities. Extremities: +2 pulses bilaterally, +2 edema Skin:  Warm and dry, no discolorations  LABS:  BMET  Recent Labs Lab 05/11/16 0606 05/12/16 0505 05/12/16 1817 05/13/16 0223  NA 136 142  --  148*  K 2.9* 2.6* 2.8* 3.0*  CL 95* 95*  --  101  CO2 30 37*  --  36*  BUN 35* 32*  --  33*  CREATININE 1.88* 1.85*  --  1.95*  GLUCOSE 101* 99  --  111*    Electrolytes  Recent Labs Lab 05/11/16 0606 05/11/16 0906 05/12/16 0505 05/13/16 0223  CALCIUM 6.0*  --  6.0* 6.5*  MG  --  1.6* 2.1 1.9  PHOS  --  3.8 2.8 2.5    CBC  Recent Labs Lab 05/10/16 0842 05/11/16 0606 05/13/16 0431  WBC 50.2* 24.2* 15.7*  HGB 9.1* 8.6* 8.8*  HCT 27.4* 25.4* 26.6*  PLT 179 140* 125*    Coag's  Recent Labs Lab 05/02/2016 1349 05/08/16 0520  APTT 28  --   INR 1.11 1.31    Sepsis Markers  Recent Labs Lab 04/26/2016 1925 05/08/16 0442 05/08/16 0520 05/10/16 0848  LATICACIDVEN 2.1* 1.9  --  1.8  PROCALCITON  --   --  30.10  --     ABG  Recent Labs Lab 05/08/16 0627 05/09/16 0746 05/10/16 0732  PHART 7.24* 7.15* 7.32*  PCO2ART 39 37 47  PO2ART  117* 82* 60*    Liver Enzymes  Recent Labs Lab 05/04/2016 1349 05/08/16 0520 05/10/16 0842 05/12/16 0505  AST 33 26 14*  --   ALT 16* 12* 10*  --   ALKPHOS 118 80 75  --   BILITOT 1.8* 1.3* 0.6  --   ALBUMIN 3.6 2.3* 2.1* 2.1*    Cardiac Enzymes  Recent Labs Lab 05/12/2016 1925 05/08/16 0054 05/08/16 0657  TROPONINI 0.03 <0.03 0.03    Glucose  Recent Labs Lab 05/12/16 0904 05/12/16 1120 05/12/16 1632 05/12/16 2025 05/12/16 2328 05/13/16 0358  GLUCAP 119* 126* 80 112* 142* 117*    Imaging No results found.  STUDIES:  Last 2-D echo was 04/16/2016 and showed an EF of 35% US LUE: Negative for DVT  CULTURES: Blood cultures  2-no growth to date Urine culture-no growth to date C. difficile negative MRSA screen positive  ANTIBIOTICS: Vancomycin 05/15/2016. Zosyn 04/21/2016-->6/22  Ceftriaxone 05/12/16>  SIGNIFICANT EVENTS: 04/26/2016: ED with altered mental status, hypotension, and abdominal pain, taken to the OR for a right ureteral stent placement  LINES/TUBES: Left IJ 04/23/2016 ET tube 05/17/2016  DISCUSSION: 75 year old male presenting with right renal calculus, UTI, and sepsis; now status post cystoscopy with right ureteral stent placement and in refractory septic shock and acute resp failure; severe acidosis  ASSESSMENT / PLAN:  PULMONARY A: Vent dependent respiratory failure due to sepsis Severe mixed respiratory and metabolic acidosis-improved P:   -Full vent support with current settings-FiO2 35%, PEEP of 5, rate of 15 and tidal volume of 500. -Daily chest x-ray and ABG -SAT/SBt as tolerated  CARDIOVASCULAR A:  Septic shock-now off pressors, blood pressure stable History of hypertension P:  -Monitor hemodynamics per ICU protocol -If MRSA screen resume pressors to maintain mean arterial blood pressure greater than 65 -Continue IV fluid boluses for CVP readings less than 12 -Continue to hold home, oral antihypertensives  RENAL A:   Acute renal failure secondary to obstructive uropathy/Rt ureteral calculus Hypokalemia P:   -Off IV fluids -Foley care per protocol -Monitor I's and O's. -Renally dose medications. -Avoid nephrotoxic medications  GASTROINTESTINAL A:   Abdominal distention P:   -Tube feeds as tolerated -Pepcid for stress ulcer prophylaxis  HEMATOLOGIC A:   Anemia-blood loss versus chronic disease P:  -Monitor CBC and transfuse when necessary  INFECTIOUS A:   Sepsis of urinary source Severe leukocytosis P:   -Follow-up cultures -Broad-spectrum antibiotics as above  ENDOCRINE A:   Steroid-induced hyperglycemia without diabetes-Blood glucose P:    -Point-of-care blood glucose monitoring every 4 hours with Sliding-scale insulin coverage  NEUROLOGIC A:   Acute metabolic and encephalopathy secondary to sepsis Vent associated discomfort P:   RASS goal: -1 to -2 -Fentanyl and Versed for vent sedation and comfort -Wake assessment every morning  Disposition and family update: No family at bedside at this time. Will update once available.   Best Practice: Code Status: DNR GI prophylaxis: H2 blocker. VTE prophylaxis:  SCD's / heparin.  Nikesh Teschner S. [facility] ANP-BC Pulmonary and Critical Care Medicine [facility] Pager [phone] or [phone]

## 2016-05-13 NOTE — Progress Notes (Signed)
MEDICATION RELATED CONSULT NOTE   Pharmacy Consult for electrolyte management  No Known Allergies  Patient Measurements: Height: 5\' 11"  (180.3 cm) Weight: 232 lb 2.3 oz (105.3 kg) IBW/kg (Calculated) : 75.3  Vital Signs: Temp: 98.4 F (36.9 C) (06/24 0000) Temp Source: Axillary (06/24 0000) BP: 108/94 mmHg (06/24 0300) Pulse Rate: 102 (06/24 0300) Intake/Output from previous day: 06/23 0701 - 06/24 0700 In: 1030.2 [I.V.:230.2; NG/GT:510; IV Piggyback:100] Out: 3000 [Urine:2700; Stool:300] Intake/Output from this shift: Total I/O In: 712.5 [I.V.:117.5; Other:190; NG/GT:305; IV Piggyback:100] Out: 1050 [Urine:1050]  Labs:  Recent Labs  05/10/16 0842 05/11/16 0606 05/11/16 0906 05/12/16 0505 05/13/16 0223  WBC 50.2* 24.2*  --   --   --   HGB 9.1* 8.6*  --   --   --   HCT 27.4* 25.4*  --   --   --   PLT 179 140*  --   --   --   CREATININE 1.96* 1.88*  --  1.85* 1.95*  MG  --   --  1.6* 2.1 1.9  PHOS  --   --  3.8 2.8 2.5  ALBUMIN 2.1*  --   --  2.1*  --   PROT 6.0*  --   --   --   --   AST 14*  --   --   --   --   ALT 10*  --   --   --   --   ALKPHOS 75  --   --   --   --   BILITOT 0.6  --   --   --   --    Estimated Creatinine Clearance: 41.7 mL/min (by C-G formula based on Cr of 1.95).   Assessment: Pharmacy consulted for electrolyte management for 75 yo male ICU patient with hypokalemia. Patient ordered potassium 10mEq IV Q1hr x 4 dose and potassium 40mEq VT x 1.     Plan:  K 3, Mg 1.9, Phos 2.5, calcium 6.5, adjusted calcium 8. Ordered potassium chloride 20 mEq po x 1, potassium chloride 10 mEq IV Q1H x 4 doses, and calcium gluconate 1 gm IV x 1. Will recheck BMP after doses administered.  Carola FrostNathan A Mack Thurmon, Pharm.D., BCPS Clinical Pharmacist 05/13/2016,3:44 AM

## 2016-05-13 NOTE — Progress Notes (Signed)
Patient transferred out of the unit. Care transferred to Hospitalist team. Dr. Sheryle Hailiamond notified. Patient is currently comfort care only  Candise Crabtree S. [facility] ANP-BC Pulmonary and Critical Care Medicine [facility] Pager [phone] or [phone]

## 2016-05-13 NOTE — Progress Notes (Signed)
Received a phone call from Nathan Figueroa. She had the patients password and wanted an updated. She has also been updated by patients son and daughter. Nathan. Cherly Figueroa states that she is in agreement with comfort care measures but she is the one who has POA and wants to be called when patient expires. I let Nathan Cousins know that I was going to contact the social worker regarding the POA situation as per the physician the daughter and son were the POA.

## 2016-05-13 NOTE — Progress Notes (Signed)
Per the conversation with Dr Dema SeverinMungal and patients son and daughter they are patients POA . Social worker Claudine looked at demographics incorrectly and apologized to son and daughter.

## 2016-05-13 NOTE — Progress Notes (Signed)
Nutrition Brief Note  Chart reviewed. Pt now transitioning to comfort care. Pt extubated and tube feeding stopped No further nutrition interventions warranted at this time.  Please re-consult as needed.   Shaelyn Decarli B. Freida BusmanAllen, RD, LDN [phone] (pager) Weekend/On-Call pager [phone]

## 2016-05-13 NOTE — Progress Notes (Signed)
LCSW met with both of patients children and provided support and let them know I was available if they needed anything today they could reach me.  Rye Decoste LCSW

## 2016-05-13 NOTE — Progress Notes (Signed)
MEDICATION RELATED CONSULT NOTE   Pharmacy Consult for electrolyte management  No Known Allergies  Patient Measurements: Height: 5\' 11"  (180.3 cm) Weight: 222 lb 0.1 oz (100.7 kg) IBW/kg (Calculated) : 75.3  Vital Signs: Temp: 98.4 F (36.9 C) (06/24 0400) Temp Source: Axillary (06/24 0400) BP: 80/61 mmHg (06/24 1110) Pulse Rate: 96 (06/24 1110) Intake/Output from previous day: 06/23 0701 - 06/24 0700 In: 1811 [I.V.:286; NG/GT:780; IV Piggyback:510] Out: 3665 [Urine:3215; Stool:450] Intake/Output from this shift:    Labs:  Recent Labs  05/11/16 0606 05/11/16 0906 05/12/16 0505 05/13/16 0223 05/13/16 0431 05/13/16 1106  WBC 24.2*  --   --   --  15.7*  --   HGB 8.6*  --   --   --  8.8*  --   HCT 25.4*  --   --   --  26.6*  --   PLT 140*  --   --   --  125*  --   CREATININE 1.88*  --  1.85* 1.95*  --  1.99*  MG  --  1.6* 2.1 1.9  --   --   PHOS  --  3.8 2.8 2.5  --   --   ALBUMIN  --   --  2.1*  --   --   --    Estimated Creatinine Clearance: 40 mL/min (by C-G formula based on Cr of 1.99).   Assessment: Pharmacy consulted for electrolyte management for 75 yo male ICU patient with hypokalemia. Patient ordered potassium 10mEq IV Q1hr x 4 dose and potassium 40mEq VT x 1.    K= 3.2 despite KCl IV 10mEq  X 4 runs   Plan:  Will reorder another set of KCl 10 meQ x 4 runs. Will recheck K @18 :00. Pharmacy to follow.   Londen Bok D, Pharm.D., BCPS Clinical Pharmacist 05/13/2016,12:33 PM

## 2016-05-13 NOTE — Progress Notes (Signed)
Spoke with Dr Dema SeverinMungal patients heart rate 170-180's. Runs of VTAC. Family is up here to speak of withdrawing. Dr Dema SeverinMungal is on his way up here. No orders given at this time.

## 2016-05-13 NOTE — Progress Notes (Signed)
Pt transferred from unit to room 106 and sister Randa EvensJoanne and son Rayna SextonRalph were notified and verbalized understanding of room change.

## 2016-05-13 NOTE — Progress Notes (Signed)
Family decided on comfort care. Dr Dema SeverinMungal will be placing orders.

## 2016-05-13 NOTE — Progress Notes (Signed)
Spoke with Dr Dema SeverinMungal regarding POA. Per Child psychotherapistsocial worker Mrs. Cherly HensenCousins (sister is POA). Dr Dema SeverinMungal will investigate. At this time no further orders.

## 2016-05-13 NOTE — Progress Notes (Signed)
Family has chosen to withdraw care. Starting morphine drip. Orders to start drip at 5 mg/hr and titrate upto10mg /hr for patient comfort. Son would like to be contacted when patient has past. He will then contact the rest of the family.

## 2016-05-13 NOTE — Progress Notes (Addendum)
LCSW was advised that [name] is not the HCPOA and I spoke with patient son upon further consultation she is the [facility] and will provide documentation on Sunday morning.HCPOA is in full agreement with pts treatment and plan of care and comfort care measures and has given verbal consent to patients children to act according to her plans and wishes.  Delta Air LinesClaudine Mazal Ebey LCSW [phone]

## 2016-05-13 NOTE — Progress Notes (Signed)
   05/13/16 1350  Clinical Encounter Type  Visited With Patient and family together  Visit Type Spiritual support  Referral From Nurse  Consult/Referral To Chaplain  Spiritual Encounters  Spiritual Needs Grief support;Emotional;Prayer  Stress Factors  Patient Stress Factors Major life changes  Family Stress Factors Major life changes;Other (Comment) (Loss of Father)  Met with the family to offer prayer and support as they remove life support from their father.

## 2016-05-13 NOTE — Progress Notes (Addendum)
[place] [facility] called ref [phone] [id number] Daria Pasturesenise Lewis. Please call with time of death.

## 2016-05-13 NOTE — Progress Notes (Signed)
LCSW consulted with CC nurse Nathan Figueroa( Nathan Figueroa ) and investigated that [name] is in fact the Health care Power of Attorney for her Brother Mr Nathan Figueroa. She has informed this worker that she has given verbal permission to Mr Nathan Figueroa's children Nathan Figueroa and Nathan Figueroa that they can visit and participate in the final decision making process with his comfort care and end of life care. She stated she is in total agreement with his plan of care and will visit tomorrow to provide all documentation. 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  No further needs at this time.   LCSW changed the assessment to reflect proper HCPOA.  Delta Air LinesClaudine Erabella Kuipers LCSW [phone]

## 2016-05-13 NOTE — Progress Notes (Signed)
Placed on high fowlers position, cuff deflated, suctioned orally and endotracheally and then extubated to 2 lpm O2 [place] 

## 2016-05-13 NOTE — Progress Notes (Signed)
Patient extubated 14:15

## 2016-05-14 MED ORDER — POLYVINYL ALCOHOL 1.4 % OP SOLN
1.0000 [drp] | OPHTHALMIC | Status: DC | PRN
  Filled 2016-05-14: qty 15

## 2016-05-14 MED ORDER — MORPHINE 100MG IN NS 100ML (1MG/ML) PREMIX INFUSION
11.0000 mg/h | INTRAVENOUS | Status: DC
  Administered 2016-05-14 – 2016-05-15 (×3): 7 mg/h via INTRAVENOUS
  Administered 2016-05-16 – 2016-05-18 (×6): 9 mg/h via INTRAVENOUS
  Administered 2016-05-18: 11 mg/h via INTRAVENOUS
  Filled 2016-05-14 (×10): qty 100

## 2016-05-14 MED ORDER — SCOPOLAMINE 1 MG/3DAYS TD PT72
1.0000 | MEDICATED_PATCH | TRANSDERMAL | Status: DC
  Filled 2016-05-14: qty 1

## 2016-05-14 MED ORDER — SODIUM CHLORIDE 0.9 % IV SOLN: 7.0000 mg/h | INTRAVENOUS | Status: DC

## 2016-05-14 MED ORDER — SCOPOLAMINE 1 MG/3DAYS TD PT72
1.0000 | MEDICATED_PATCH | TRANSDERMAL | Status: DC
  Administered 2016-05-16: 1.5 mg via TRANSDERMAL
  Filled 2016-05-14 (×2): qty 1

## 2016-05-14 NOTE — Progress Notes (Signed)
LCSW met with [name] and provided 1-1 support for her. Her friend came to pick her up. She met with her nephew and his wife and Dr Stevenson Clinch and comfort care again was explained to his sister ( [name]) and family by Dr Stevenson Clinch. No further needs at this time.  [employer] LCSW [phone]

## 2016-05-14 NOTE — Progress Notes (Signed)
TRANSFER SUMMARY:  Date of admission-March 14, 2016 Date of transfer-05/13/16  Problem list: Ventilator-dependent respiratory failure-resolved Sepsis [place] to urethral calculus Metabolic acidosis Status post urethral stenting Ureteral calculus Hypokalemia Anemia of chronic disease Hypoglycemia, steroid-induced hyperglycemia Acute metabolic encephalopathy Dementia Poor prognosis, comfort care  See full H&P by Dr. Belia HemanKasa on 26-Oct-2016 Brief H&P: 75 year old male past medical history of frequent UTIs, BPH, hypertension, anemia, bronchitis, paroxysmal tachycardia presented to the ED on 618 with decreased level of conscious abdominal pain. At ED was found to be hypotensive with MAP in the low 50s, UA abnormal, CT scan of abdomen showed 2 adjacent stones lying in the mid to distal right ureter largest measuring 6 mm causing mild hydronephrosis, taken to the or for cystoscopy and a right ureteral stent placement, postop was hypotensive despite appetite and adequate fluid resuscitation, was subsequently intubated and sedated and manage in the ICU. Off note, patient was recently hospitalized from 5/27-6/1 for urosepsis/UTI, non-ST elevated MI and acute kidney injury.  Hospital course: Throughout his admission and ICU his pressors were weaned off briefly, he was treated with vancomycin, Zosyn, ceftriaxone. He has a left internal jugular CVL, he was intubated on 6/18, and extubated on 6/24. His extubation was a planned one-way extubation due to failing spontaneous breathing trial on multiple attempts.  Dr. Belia HemanKasa maintained close contact with the family, given his poor prognosis and his failure to be completely liberated from the ventilator, family has stated his quality of life would not be any significantly different, they've decided on comfort care measures, and denied any consideration for tracheostomy or PEG tube placement. Final comfort care measures were initiated on 6/25. Health care power of attorney as  listed is a sister, however, patient has 2 children (son and daughter) that make final decisions.  Stephanie AcreVishal Araly Kaas, MD [facility] Pager [phone] (please enter 7-digits) On Call Pager - [phone] (please enter 7-digits)

## 2016-05-14 NOTE — Plan of Care (Signed)
Problem: Health Behavior/Discharge Planning: Goal: Ability to manage health-related needs will improve Outcome: Not Progressing Patient transferred from CCU this shift with comfort care orders and morphine drip infusing.   Problem: Pain Managment: Goal: General experience of comfort will improve Outcome: Completed/Met Date Met:  05/14/16 Patient transferred from CCU this shift with comfort care orders and morphine drip infusing.   Problem: Physical Regulation: Goal: Ability to maintain clinical measurements within normal limits will improve Outcome: Completed/Met Date Met:  05/14/16 Patient transferred from CCU this shift with comfort care orders and morphine drip infusing.  Goal: Will remain free from infection Outcome: Completed/Met Date Met:  05/14/16 Patient transferred from CCU this shift with comfort care orders and morphine drip infusing.   Problem: Tissue Perfusion: Goal: Risk factors for ineffective tissue perfusion will decrease Outcome: Not Progressing Patient transferred from CCU this shift with comfort care orders and morphine drip infusing.   Problem: Activity: Goal: Risk for activity intolerance will decrease Outcome: Not Progressing Patient transferred from CCU this shift with comfort care orders and morphine drip infusing.   Problem: Fluid Volume: Goal: Ability to maintain a balanced intake and output will improve Outcome: Not Progressing Patient transferred from CCU this shift with comfort care orders and morphine drip infusing.   Problem: Nutrition: Goal: Adequate nutrition will be maintained Outcome: Not Progressing Patient transferred from CCU this shift with comfort care orders and morphine drip infusing.

## 2016-05-14 NOTE — Clinical Social Work Note (Signed)
Pt's sister, Ms. Cousins, requested to see a CSW. CSW met with pt's sister, and she provided HCPOA paperwork. CSW made a copy and put it on the chart. CSW. Pt's son was present when CSW returned paperwork. Pt's son shared that he thought he was 26. CSW asked if he had paperwork stating this, and he stated that he did not. CSW inquired if there was a concern with the plan of care. Pt's sister and pt's son, stated no. Pt's sister stated that she is agreeable to plan of care decisions made by children. CSW encouraged pt's sister and children to come together and discuss plan of care with each other in order for everyone to be on the same page and to avoid any confusion. CSW also paged MD to speak with pt's family.   Pt is comfort care. CSW will continue to follow for discharge planning needs, whether is to return to [facility] with Hospice or to a residential Hospice facility. CSW will address options when pt is stable.   Darden Dates, MSW, LCSW  Clinical Social Worker  [phone]

## 2016-05-14 NOTE — Progress Notes (Signed)
[facility] [place] at [facility]                                                                                                                                                                                            Patient Demographics   Nathan Figueroa, is a 75 y.o. male, DOB - 16-Dec-1940, ZOX:[id number]  Admit date - 2016/05/10   Admitting Physician Malen Gauze, MD  Outpatient Primary MD for the patient is DAVID Terance Hart, MD   LOS - 7  Subjective: Patient transferred from the intensivist service. Where he was admitted with sepsis, acute respiratory failure with no significant improvement therefore he was made comfort care.     Review of Systems:   CONSTITUTIONAL: Unable to provide due to his mental status  Vitals:   Filed Vitals:   05/14/16 0030 05/14/16 0230 05/14/16 0400 05/14/16 0450  BP:      Pulse:      Temp:      TempSrc:      Resp: 12 16 12 16   Height:      Weight:      SpO2: 97%       Wt Readings from Last 3 Encounters:  05/13/16 100.7 kg (222 lb 0.1 oz)  04/20/16 93.441 kg (206 lb)     Intake/Output Summary (Last 24 hours) at 05/14/16 1124 Last data filed at 05/14/16 0700  Gross per 24 hour  Intake  117.4 ml  Output   1025 ml  Net -907.6 ml    Physical Exam:   GENERAL: Appears comfortable HEAD, EYES, EARS, NOSE AND THROAT: Atraumatic, normocephalic.  are intact. Pupils equal and reactive to light. Sclerae anicteric. No conjunctival injection. No oro-pharyngeal erythema.  NECK: Supple. There is no jugular venous distention. No bruits, no lymphadenopathy, no thyromegaly.  HEART: Regular rate and rhythm,. No murmurs, no rubs, no clicks.  LUNGS: Clear to auscultation bilaterally. No rales or rhonchi. No wheezes.  ABDOMEN: Soft, flat, nontender, nondistended. Has good bowel sounds. No hepatosplenomegaly appreciated.  EXTREMITIES: No evidence of any cyanosis, clubbing, or peripheral edema.  +2 pedal and radial  pulses bilaterally.  NEUROLOGIC: The patient is comfortable  SKIN: Moist and warm with no rashes appreciated.  Psych: Not anxious, depressed LN: No inguinal LN enlargement    Antibiotics   Anti-infectives    Start     Dose/Rate Route Frequency Ordered Stop   05/12/16 1800  amoxicillin (AMOXIL) capsule 500 mg  Status:  Discontinued     500 mg Oral Every 24 hours 05/12/16 1504 05/12/16 1504   05/12/16 1800  cefTRIAXone (ROCEPHIN) 2 g in dextrose  5 % 50 mL IVPB  Status:  Discontinued     2 g 100 mL/hr over 30 Minutes Intravenous Every 24 hours 05/12/16 1505 05/13/16 1336   05/12/16 1000  cefTRIAXone (ROCEPHIN) 2 g in dextrose 5 % 50 mL IVPB  Status:  Discontinued     2 g 100 mL/hr over 30 Minutes Intravenous Every 24 hours 05/11/16 1431 05/12/16 1504   05/11/16 1030  cefTRIAXone (ROCEPHIN) 2 g in dextrose 5 % 50 mL IVPB  Status:  Discontinued     2 g 100 mL/hr over 30 Minutes Intravenous Every 24 hours 05/11/16 1015 05/11/16 1431   05/11/2016 2030  piperacillin-tazobactam (ZOSYN) IVPB 3.375 g  Status:  Discontinued     3.375 g 12.5 mL/hr over 240 Minutes Intravenous Every 8 hours 04/27/2016 1933 05/11/16 1015   05/06/2016 2030  vancomycin (VANCOCIN) IVPB 1000 mg/200 mL premix  Status:  Discontinued     1,000 mg 200 mL/hr over 60 Minutes Intravenous Every 24 hours 05/12/2016 1933 05/09/16 1034   05/03/2016 1400  piperacillin-tazobactam (ZOSYN) IVPB 3.375 g     3.375 g 100 mL/hr over 30 Minutes Intravenous  Once 04/27/2016 1349 04/29/2016 1457   04/22/2016 1400  vancomycin (VANCOCIN) IVPB 1000 mg/200 mL premix     1,000 mg 200 mL/hr over 60 Minutes Intravenous  Once 05/01/2016 1349 05/12/2016 1710      Medications   Scheduled Meds: . [START ON 05/16/2016] scopolamine  1 patch Transdermal Q72H   Continuous Infusions: . morphine 7 mg/hr (05/14/16 0919)   PRN Meds:.polyvinyl alcohol   Data Review:   Micro Results Recent Results (from the past 240 hour(s))  Blood Culture (routine x 2)      Status: None   Collection Time: 04/21/2016  2:20 PM  Result Value Ref Range Status   Specimen Description BLOOD LEFT FOREARM  Final   Special Requests BOTTLES DRAWN AEROBIC AND ANAEROBIC  1CC  Final   Culture NO GROWTH 5 DAYS  Final   Report Status 05/12/2016 FINAL  Final  Urine culture     Status: None   Collection Time: 05/19/2016  2:20 PM  Result Value Ref Range Status   Specimen Description URINE, RANDOM  Final   Special Requests NONE  Final   Culture NO GROWTH Performed at [facility]   Final   Report Status 05/08/2016 FINAL  Final  Blood Culture (routine x 2)     Status: None   Collection Time: 04/28/2016  2:32 PM  Result Value Ref Range Status   Specimen Description BLOOD RIGHT ANTECUBITAL  Final   Special Requests BOTTLES DRAWN AEROBIC AND ANAEROBIC  1CC  Final   Culture NO GROWTH 5 DAYS  Final   Report Status 05/12/2016 FINAL  Final  Surgical pcr screen     Status: Abnormal   Collection Time: 05/10/2016  6:30 PM  Result Value Ref Range Status   MRSA, PCR POSITIVE (A) NEGATIVE Final    Comment: CRITICAL RESULT CALLED TO, READ BACK BY AND VERIFIED WITH: LESLIE LEWIS AT 2016 04/20/2016.PMH    Staphylococcus aureus POSITIVE (A) NEGATIVE Final    Comment:        The Xpert SA Assay (FDA approved for NASAL specimens in patients over 37 years of age), is one component of a comprehensive surveillance program.  Test performance has been validated by [facility] for patients greater than or equal to 24 year old. It is not intended to diagnose infection nor to guide or monitor treatment.  C difficile quick scan w PCR reflex     Status: None   Collection Time: 05/08/16 11:00 AM  Result Value Ref Range Status   C Diff antigen NEGATIVE NEGATIVE Final   C Diff toxin NEGATIVE NEGATIVE Final   C Diff interpretation Negative for C. difficile  Final    Radiology Reports Ct Abdomen Pelvis Wo Contrast  04/27/2016  CLINICAL DATA:  Pt very poor historian. Pt states that his belly  hurts. Per note in pt chart "Pt presents to ED from [facility] with a c/o altered mental status and code sepsis. Per EMS pt was hospitalized for a week for pneumonia and was started on levaquin yesterday. Per EMS pt is normally alert and oriented, presents to ED confused and per EMS combative. Pt presents to ED with staff moaning and with incomprehensible speech. Pt noted to be pale on arrival." No IV contrast per GFR value, pt unable to drink PO contrast. EXAM: CT ABDOMEN AND PELVIS WITHOUT CONTRAST TECHNIQUE: Multidetector CT imaging of the abdomen and pelvis was performed following the standard protocol without IV contrast. COMPARISON:  None. FINDINGS: Lung bases: Right greater than left dependent linear and reticular type opacities likely due to atelectasis, scarring or a combination. Heart is normal in size. Hepatobiliary: Fatty infiltration of the liver. Low-density homogeneous mass in the right lobe adjacent to the gallbladder measuring 2 cm consistent with a cyst. No other liver masses or lesions. Gallbladder is abnormal appearance. It has increased attenuation material throughout with evidence of wall thickening and adjacent inflammation. No bile duct dilation. Spleen, pancreas, adrenal glands:  Unremarkable. Kidneys, ureters, bladder: Mild right hydronephrosis with hydroureter extending to the right mid to distal ureter at the level of the pelvic ran. There are 2 adjacent stones in this location, larger measuring 6 mm. There are no additional stones below this. There are multiple nonobstructing intrarenal stones on the right. No left intrarenal stones. There are low-density renal masses. There is a 4.5 cm midpole mass in the right and a 2.6 cm midpole mass on the left, both consistent with cysts. No left hydronephrosis. Normal left ureter. Bladder is unremarkable. There changes from previous prostate surgery. Lymph nodes:  No pathologically enlarged lymph nodes. Ascites:  None. Gastrointestinal: There  is significant distention of the rectum with stool. There is some mild hazy inflammatory changes adjacent to the rectum and lower sigmoid colon. Scattered diverticula noted along the sigmoid colon. No evidence of diverticulitis. There is no evidence of bowel obstruction. There is no bowel wall thickening. Stomach is unremarkable. Normal appendix visualized. 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 Degenerative changes are noted along the visualized spine. No osteoblastic or osteolytic lesions. IMPRESSION: 1. Two adjacent stones lie in the mid to distal right ureter, largest measuring 6 mm, causing mild right hydroureteronephrosis. 2. Gallbladder also appears abnormal with increased attenuation material and evidence of wall thickening and adjacent inflammatory change. Consider acute cholecystitis if this correlates clinically, which could be further assessed with limited right upper quadrant ultrasound. 3. Rectum is significantly distended with stool and there is mild hazy inflammatory change in the perirectal fat in the fat adjacent to the lower sigmoid colon. Consider proctitis if this patient has rectal/anal pain. 4. No other acute findings. 5. Hepatic steatosis. Hepatic cyst. Bilateral renal cysts. Nonobstructing stones in the right kidney. Sigmoid colon diverticula without diverticulitis. Arthropathic changes of the hips and degenerative changes of spine. Electronically Signed   By: Amie Portland M.D.   On: 04/29/2016 15:26  Dg Chest 1 View  04/17/2016  CLINICAL DATA:  75 year old male with history of sepsis. EXAM: CHEST 1 VIEW COMPARISON:  Chest x-ray a 04/15/2016. FINDINGS: Mild diffuse interstitial prominence throughout the lungs bilaterally. Diffuse peribronchial cuffing. No definite pleural effusions. No definite consolidative airspace disease. Heart size appears within normal limits. The patient is extremely rotated to the right on today's exam, resulting in distortion of the  mediastinal contours and reduced diagnostic sensitivity and specificity for mediastinal pathology. Surgical clips in the right hilar region. IMPRESSION: 1. Limited study secondary to extreme patient rotation to the right. With these limitations in mind, there does appear to be diffuse peribronchial cuffing and interstitial prominence, which suggests an acute bronchitis. Electronically Signed   By: Trudie Reed M.D.   On: 04/17/2016 15:09   Dg Abd 1 View  05/17/2016  CLINICAL DATA:  Orogastric tube placement.  Initial encounter. EXAM: ABDOMEN - 1 VIEW COMPARISON:  CT of the abdomen and pelvis performed earlier today at 3:08 p.m. FINDINGS: The patient's enteric tube is seen ending overlying the body of the stomach, with the side port about the fundus of the stomach. The visualized bowel gas pattern is grossly unremarkable. Air-filled loops of small and large bowel are noted. The stomach is partially filled with air. No free intra-abdominal air is seen, though evaluation for free air is limited on a single supine view. Clips are noted within the right upper quadrant, reflecting prior cholecystectomy. No acute osseous abnormalities are identified. IMPRESSION: 1. Enteric tube noted ending overlying the body of the stomach, with the side port about the fundus of the stomach. 2. Air-filled loops of small and large bowel may reflect mild dysmotility. Electronically Signed   By: Roanna Raider M.D.   On: 04/25/2016 20:12   US Venous Img Upper Uni Left  05/09/2016  CLINICAL DATA:  Left arm swelling x1 day. EXAM: LEFT UPPER EXTREMITY VENOUS DOPPLER ULTRASOUND TECHNIQUE: Gray-scale sonography with graded compression, as well as color Doppler and duplex ultrasound were performed to evaluate the upper extremity deep venous system from the level of the subclavian vein and including the jugular, axillary, basilic and upper cephalic vein. Spectral Doppler was utilized to evaluate flow at rest and with distal augmentation  maneuvers. COMPARISON:  None. FINDINGS: Thrombus within deep veins:  None visualized. Compressibility of deep veins:  Normal. Duplex waveform respiratory phasicity:  Normal. Duplex waveform response to augmentation:  Normal. Venous reflux:  None visualized. Other findings: Limited images of the contralateral right subclavian vein are unremarkable. IMPRESSION: 1. Negative for left upper extremity DVT. Electronically Signed   By: Corlis Leak M.D.   On: 05/09/2016 20:23   Dg Chest Port 1 View  05/13/2016  CLINICAL DATA:  Acute respiratory failure EXAM: PORTABLE CHEST 1 VIEW COMPARISON:  Chest x-ray dated 05/08/2016. FINDINGS: Endotracheal tube remains well positioned with tip just above the level of the carina. Left IJ central line is stable in position with tip at the level of the mid SVC. Enteric tube passes into the stomach. Overall cardiomediastinal silhouette is stable in size and configuration. Cardiomegaly is stable. Surgical clips again noted at the right hilum. Probable mild atelectasis at each lung base. Lungs otherwise clear. Right lung apex is partially obscured by overlying soft tissues. IMPRESSION: No significant interval change. Probable mild bibasilar atelectasis. Electronically Signed   By: Bary Richard M.D.   On: 05/13/2016 10:41   Dg Chest Port 1 View  05/08/2016  CLINICAL DATA:  Respiratory failure. EXAM: PORTABLE CHEST  1 VIEW COMPARISON:  05/16/2016. FINDINGS: Interval placement of NG tube, its tip is below left hemidiaphragm P Endotracheal tube, left IJ line in stable position. Stable cardiomegaly. Mild bibasilar subsegmental atelectasis and/or infiltrates. Small bilateral pleural effusions. Postsurgical changes right lung. No pneumothorax . IMPRESSION: 1. Interval placement of NG tube, its tip is below left hemidiaphragm. Endotracheal tube and left IJ line stable position. 2. Low lung volumes with mild bibasilar atelectasis and/or infiltrates again noted. Small bilateral pleural effusions.  3. Postsurgical changes right lung. Electronically Signed   By: Maisie Fus  Register   On: 05/08/2016 07:19   Dg Chest Port 1 View  05/17/2016  CLINICAL DATA:  Acute onset of hypotension and altered mental status. Urosepsis. Initial encounter. EXAM: PORTABLE CHEST 1 VIEW COMPARISON:  Chest radiograph performed earlier today at 2:09 p.m. FINDINGS: The patient's endotracheal tube is seen ending 5 cm above the carina. A left IJ line is noted ending about the mid SVC. Small bilateral pleural effusions are suggested. Mild bibasilar opacities may reflect pneumonia, given the patient's symptoms. No pneumothorax is seen. The cardiomediastinal silhouette is mildly enlarged. No acute osseous abnormalities are identified. IMPRESSION: 1. Endotracheal tube seen ending 5 cm above the carina. 2. Small bilateral pleural effusions suggested. Mild bibasilar opacities may reflect pneumonia, given the patient's symptoms. 3. Mild cardiomegaly. Electronically Signed   By: Roanna Raider M.D.   On: 04/20/2016 19:19   Dg Chest Port 1 View  04/21/2016  CLINICAL DATA:  Sepsis. EXAM: PORTABLE CHEST 1 VIEW COMPARISON:  Radiograph of Apr 17, 2016. FINDINGS: Stable cardiomediastinal silhouette. Right hilar clips are noted. No pneumothorax or pleural effusion is noted. No acute pulmonary disease is noted. Bony thorax is unremarkable. IMPRESSION: No acute cardiopulmonary abnormality seen. Electronically Signed   By: Lupita Raider, M.D.   On: 05/19/2016 14:29   Dg Chest Port 1 View  04/15/2016  CLINICAL DATA:  Sepsis.  Diaphoresis.  Fever.  Tachycardia. EXAM: PORTABLE CHEST 1 VIEW COMPARISON:  09/01/2012. FINDINGS: Poor inspiration. Interval mild enlargement of the cardiac silhouette. Interval mild prominence of the interstitial markings. Progressive elevation of the right hemidiaphragm. No airspace consolidation seen. Diffuse osteopenia. Right hilar surgical clips are again demonstrated. IMPRESSION: 1. Interval mild cardiomegaly and mild  interstitial lung disease with an appearance suggesting chronic interstitial lung disease. 2. No evidence of pneumonia on a single view. Electronically Signed   By: Beckie Salts M.D.   On: 04/15/2016 10:35   Dg Abd Portable 1v  05/11/2016  CLINICAL DATA:  Enteric tube placement EXAM: PORTABLE ABDOMEN - 1 VIEW COMPARISON:  04/22/2016 abdominal radiograph FINDINGS: Enteric tube terminates in the distal stomach. Surgical clips are seen in the right upper quadrant of the abdomen. Partially visualized right nephro ureteral stent with the proximal pigtail portion overlying the expected location of the right renal pelvis. Patchy bibasilar lung opacities. Mild gaseous distention of bowel loops in the visualized upper abdomen, probably colonic. IMPRESSION: 1. Enteric tube terminates in the distal stomach. 2. Mild gaseous distention of bowel loops in the visualized upper abdomen, favor colonic, probably a mild adynamic ileus. 3. Nonspecific patchy bibasilar lung opacities, correlate with chest radiograph as clinically warranted. Electronically Signed   By: Delbert Phenix M.D.   On: 05/11/2016 13:43     CBC  Recent Labs Lab 04/28/2016 1349  05/08/16 0657 05/09/16 0422 05/10/16 0842 05/11/16 0606 05/13/16 0431  WBC 38.0*  < > 68.4* 58.9* 50.2* 24.2* 15.7*  HGB 14.2  < > 10.2* 9.2* 9.1* 8.6*  8.8*  HCT 42.8  < > 33.2* 29.6* 27.4* 25.4* 26.6*  PLT 310  < > 268 245 179 140* 125*  MCV 91.5  < > 94.6 94.4 89.4 89.5 90.3  MCH 30.3  < > 28.9 29.2 29.6 30.1 29.9  MCHC 33.1  < > 30.5* 31.0* 33.2 33.6 33.1  RDW 16.3*  < > 16.2* 16.2* 15.3* 14.9* 15.3*  LYMPHSABS 1.4  --   --   --   --   --   --   MONOABS 0.9  --   --   --   --   --   --   EOSABS 0.0  --   --   --   --   --   --   BASOSABS 0.0  --   --   --   --   --   --   < > = values in this interval not displayed.  Chemistries   Recent Labs Lab 04/17/16 1349 04/17/16 1925 05/08/16 0520  05/10/16 [id number] 05/11/16 0606 05/11/16 0906 05/12/16 0505  05/12/16 1817 05/13/16 0223 05/13/16 1106  NA 136 139 138  < > 133* 136  --  142  --  148* 149*  K 6.8* 4.9 5.8*  < > 4.2 2.9*  --  2.6* 2.8* 3.0* 3.2*  CL 101 111 113*  < > 98* 95*  --  95*  --  101 103  CO2 22 19* 17*  < > 24 30  --  37*  --  36* 37*  GLUCOSE 238* 183* 274*  < > 113* 101*  --  99  --  111* 102*  BUN 32* 35* 34*  < > 36* 35*  --  32*  --  33* 38*  CREATININE 3.16* 2.99* 2.59*  < > 1.96* 1.88*  --  1.85*  --  1.95* 1.99*  CALCIUM 9.0 7.2* 6.7*  < > 6.0* 6.0*  --  6.0*  --  6.5* 6.7*  MG  --  2.6* 2.3  --   --   --  1.6* 2.1  --  1.9  --   AST 33  --  26  --  14*  --   --   --   --   --   --   ALT 16*  --  12*  --  10*  --   --   --   --   --   --   ALKPHOS 118  --  80  --  75  --   --   --   --   --   --   BILITOT 1.8*  --  1.3*  --  0.6  --   --   --   --   --   --   < > = values in this interval not displayed. ------------------------------------------------------------------------------------------------------------------ estimated creatinine clearance is 40 mL/min (by C-G formula based on Cr of 1.99). ------------------------------------------------------------------------------------------------------------------ No results for input(s): HGBA1C in the last 72 hours. ------------------------------------------------------------------------------------------------------------------ No results for input(s): CHOL, HDL, LDLCALC, TRIG, CHOLHDL, LDLDIRECT in the last 72 hours. ------------------------------------------------------------------------------------------------------------------ No results for input(s): TSH, T4TOTAL, T3FREE, THYROIDAB in the last 72 hours.  Invalid input(s): FREET3 ------------------------------------------------------------------------------------------------------------------ No results for input(s): VITAMINB12, FOLATE, FERRITIN, TIBC, IRON, RETICCTPCT in the last 72 hours.  Coagulation profile  Recent Labs Lab 04/17/16 1349  05/08/16 0520  INR 1.11 1.31    No results for input(s): DDIMER in the last 72 hours.  Cardiac Enzymes  Recent Labs Lab 05/19/2016 1925 05/08/16 0054 05/08/16 0657  TROPONINI 0.03 <0.03 0.03   ------------------------------------------------------------------------------------------------------------------ Invalid input(s): POCBNP    Assessment & Plan  Patient is a 75 year old who was admitted with sepsis syndrome with below stated issues was transferred to the medical service who is currently comfort care. And on morphine drip. We will continue with the morphine drip just for patient's comfort no labs no labs. Family all in agreement  Ventilator-dependent respiratory failure-resolved- supportive care Sepsis due to to urethral calculus- Comfort Care Metabolic acidosis Status post urethral stenting Ureteral calculus Hypokalemia Anemia of chronic disease Hypoglycemia, steroid-induced hyperglycemia Acute metabolic encephalopathy Dementia     Code Status Orders        Start     Ordered   05/14/16 0822  Do not attempt resuscitation (DNR)   Continuous    Question Answer Comment  In the event of cardiac or respiratory ARREST Do not call a "code blue"   In the event of cardiac or respiratory ARREST Do not perform Intubation, CPR, defibrillation or ACLS   In the event of cardiac or respiratory ARREST Use medication by any route, position, wound care, and other measures to relive pain and suffering. May use oxygen, suction and manual treatment of airway obstruction as needed for comfort.      05/14/16 [id number]    Code Status History    Date Active Date Inactive Code Status Order ID Comments User Context   05/09/2016 10:43 AM 05/14/2016  8:18 AM DNR [id number]  Erin FullingKurian Kasa, MD Inpatient   05/06/2016  6:42 PM 05/09/2016 10:43 AM Full Code [id number]  Merwyn Katosavid B Simonds, MD Inpatient   04/25/2016  4:28 PM 05/16/2016  6:42 PM Full Code [id number]  Loleta Roseory Forbach, MD ED   04/15/2016  2:02 PM  04/20/2016 10:10 PM Full Code [id number]  Adrian SaranSital Mody, MD Inpatient    Advance Directive Documentation        Most Recent Value   Type of Advance Directive  Out of facility DNR (pink MOST or yellow form)   Pre-existing out of facility DNR order (yellow form or pink MOST form)     "MOST" Form in Place?                DVT Prophylaxis  Comfort Care  Lab Results  Component Value Date   PLT 125* 05/13/2016     Time Spent in minutes  20 minutes  Auburn BilberryPATEL, Kyla Duffy M.D on 05/14/2016 at 11:24 AM  Between 7am to 6pm - Pager - [phone]  After 6pm go to [email or web address] - password [email or web address] [facility]  [facility]   Office  [phone]

## 2016-05-15 NOTE — Care Management Important Message (Signed)
Important Message  Patient Details  Name: Nathan NeighboursRalph L Depaul MRN: [id number] Date of Birth: 06/27/1941   Medicare Important Message Given:  Yes    Collie SiadAngela Calbert Hulsebus, RN 05/15/2016, 8:21 AM

## 2016-05-15 NOTE — Progress Notes (Signed)
Pt displaying signs of discomfort.  On call prime MD Dr. Renae GlossWieting paged, verbal order given for 289ml/hr.  Orson Apeanielle Janel Beane, RN

## 2016-05-15 NOTE — Plan of Care (Signed)
Problem: Education: Goal: Knowledge of [facility] Education information/materials will improve Outcome: Not Progressing Patient unable, patient has comfort care orders and morphine drip infusing, unresponsive.   Problem: Health Behavior/Discharge Planning: Goal: Ability to manage health-related needs will improve Outcome: Not Progressing Patient unable, patient has comfort care orders and morphine drip infusing, unresponsive.   Problem: Tissue Perfusion: Goal: Risk factors for ineffective tissue perfusion will decrease Outcome: Not Progressing Patient unable, patient has comfort care orders and morphine drip infusing, unresponsive.   Problem: Activity: Goal: Risk for activity intolerance will decrease Outcome: Not Progressing Patient unable, patient has comfort care orders and morphine drip infusing, unresponsive.   Problem: Fluid Volume: Goal: Ability to maintain a balanced intake and output will improve Outcome: Not Progressing Patient unable, patient has comfort care orders and morphine drip infusing, unresponsive.   Problem: Nutrition: Goal: Adequate nutrition will be maintained Outcome: Not Progressing Patient unable, patient has comfort care orders and morphine drip infusing, unresponsive.

## 2016-05-15 NOTE — Progress Notes (Signed)
[facility] [place] [facility]                                                                                                                                                                                            Patient Demographics   Nathan LucyRalph Figueroa, is a 75 y.o. male, DOB - 03/31/1941, ZOX:[id number]  Admit date - 05/03/2016   Admitting Physician Malen GauzePatrick L McKenzie, MD  Outpatient Primary MD for the patient is Nathan Terance HartBRONSTEIN, MD   LOS - 8  Subjective: Patient transferred from the intensivist service. Where he was admitted with sepsis, acute respiratory failure with no significant improvement therefore he was made comfort care.On morphine drip at this time. Hypotensive this morning. Unresponsive.     Review of Systems:   CONSTITUTIONAL: Unable to provide due to his mental status  Vitals:   Filed Vitals:   05/15/16 0000 05/15/16 0330 05/15/16 0436 05/15/16 0600  BP:   72/44   Pulse:   102   Temp:   99.5 F (37.5 C)   TempSrc:   Axillary   Resp: 14 12 10 10   Height:      Weight:      SpO2:   96%     Wt Readings from Last 3 Encounters:  05/13/16 100.7 kg (222 lb 0.1 oz)  04/20/16 93.441 kg (206 lb)     Intake/Output Summary (Last 24 hours) at 05/15/16 0900 Last data filed at 05/15/16 0531  Gross per 24 hour  Intake      0 ml  Output    600 ml  Net   -600 ml    Physical Exam:   GENERAL: Appears comfortable Lesion appearing HEAD, EYES, EARS, NOSE AND THROAT: Atraumatic, normocephalic.  are intact. Pupils equal and reactive to light. Sclerae anicteric. No conjunctival injection. No oro-pharyngeal erythema.  NECK: Supple. There is no jugular venous distention. No bruits, no lymphadenopathy, no thyromegaly.  HEART: Regular rate and rhythm,. No murmurs, no rubs, no clicks.  LUNGS: Clear to auscultation bilaterally. No rales or rhonchi. No wheezes.  ABDOMEN: Soft, flat, nontender, nondistended. Has good bowel sounds. No  hepatosplenomegaly appreciated.  EXTREMITIES: No evidence of any cyanosis, clubbing, or peripheral edema.  +2 pedal and radial pulses bilaterally.  NEUROLOGIC: The patient is comfortable  SKIN: Moist and warm with no rashes appreciated.  Psych: Not anxious, depressed LN: No inguinal LN enlargement    Antibiotics   Anti-infectives    Start     Dose/Rate Route Frequency Ordered Stop   05/12/16 1800  amoxicillin (AMOXIL) capsule 500 mg  Status:  Discontinued  500 mg Oral Every 24 hours 05/12/16 1504 05/12/16 1504   05/12/16 1800  cefTRIAXone (ROCEPHIN) 2 g in dextrose 5 % 50 mL IVPB  Status:  Discontinued     2 g 100 mL/hr over 30 Minutes Intravenous Every 24 hours 05/12/16 1505 05/13/16 1336   05/12/16 1000  cefTRIAXone (ROCEPHIN) 2 g in dextrose 5 % 50 mL IVPB  Status:  Discontinued     2 g 100 mL/hr over 30 Minutes Intravenous Every 24 hours 05/11/16 1431 05/12/16 1504   05/11/16 1030  cefTRIAXone (ROCEPHIN) 2 g in dextrose 5 % 50 mL IVPB  Status:  Discontinued     2 g 100 mL/hr over 30 Minutes Intravenous Every 24 hours 05/11/16 1015 05/11/16 1431   June 01, 2016 2030  piperacillin-tazobactam (ZOSYN) IVPB 3.375 g  Status:  Discontinued     3.375 g 12.5 mL/hr over 240 Minutes Intravenous Every 8 hours 2016/06/01 1933 05/11/16 1015   06/01/16 2030  vancomycin (VANCOCIN) IVPB 1000 mg/200 mL premix  Status:  Discontinued     1,000 mg 200 mL/hr over 60 Minutes Intravenous Every 24 hours 06/01/2016 1933 05/09/16 1034   01-Jun-2016 1400  piperacillin-tazobactam (ZOSYN) IVPB 3.375 g     3.375 g 100 mL/hr over 30 Minutes Intravenous  Once June 01, 2016 1349 Jun 01, 2016 1457   06-01-2016 1400  vancomycin (VANCOCIN) IVPB 1000 mg/200 mL premix     1,000 mg 200 mL/hr over 60 Minutes Intravenous  Once 06/01/2016 1349 June 01, 2016 1710      Medications   Scheduled Meds: . [START ON 05/16/2016] scopolamine  1 patch Transdermal Q72H   Continuous Infusions: . morphine 7 mg/hr (05/14/16 2236)   PRN Meds:.polyvinyl  alcohol   Data Review:   Micro Results Recent Results (from the past 240 hour(s))  Blood Culture (routine x 2)     Status: None   Collection Time: 01-Jun-2016  2:20 PM  Result Value Ref Range Status   Specimen Description BLOOD LEFT FOREARM  Final   Special Requests BOTTLES DRAWN AEROBIC AND ANAEROBIC  1CC  Final   Culture NO GROWTH 5 DAYS  Final   Report Status 05/12/2016 FINAL  Final  Urine culture     Status: None   Collection Time: 06-01-2016  2:20 PM  Result Value Ref Range Status   Specimen Description URINE, RANDOM  Final   Special Requests NONE  Final   Culture NO GROWTH Performed at [facility]   Final   Report Status 05/08/2016 FINAL  Final  Blood Culture (routine x 2)     Status: None   Collection Time: 2016-06-01  2:32 PM  Result Value Ref Range Status   Specimen Description BLOOD RIGHT ANTECUBITAL  Final   Special Requests BOTTLES DRAWN AEROBIC AND ANAEROBIC  1CC  Final   Culture NO GROWTH 5 DAYS  Final   Report Status 05/12/2016 FINAL  Final  Surgical pcr screen     Status: Abnormal   Collection Time: June 01, 2016  6:30 PM  Result Value Ref Range Status   MRSA, PCR POSITIVE (A) NEGATIVE Final    Comment: CRITICAL RESULT CALLED TO, READ BACK BY AND VERIFIED WITH: LESLIE LEWIS AT 2016 2016-06-01.PMH    Staphylococcus aureus POSITIVE (A) NEGATIVE Final    Comment:        The Xpert SA Assay (FDA approved for NASAL specimens in patients over 57 years of age), is one component of a comprehensive surveillance program.  Test performance has been validated by [facility] for patients greater  than or equal to 76 year old. It is not intended to diagnose infection nor to guide or monitor treatment.   C difficile quick scan w PCR reflex     Status: None   Collection Time: 05/08/16 11:00 AM  Result Value Ref Range Status   C Diff antigen NEGATIVE NEGATIVE Final   C Diff toxin NEGATIVE NEGATIVE Final   C Diff interpretation Negative for C. difficile  Final     Radiology Reports Ct Abdomen Pelvis Wo Contrast  05/12/2016  CLINICAL DATA:  Pt very poor historian. Pt states that his belly hurts. Per note in pt chart "Pt presents to ED from [facility] with a c/o altered mental status and code sepsis. Per EMS pt was hospitalized for a week for pneumonia and was started on levaquin yesterday. Per EMS pt is normally alert and oriented, presents to ED confused and per EMS combative. Pt presents to ED with staff moaning and with incomprehensible speech. Pt noted to be pale on arrival." No IV contrast per GFR value, pt unable to drink PO contrast. EXAM: CT ABDOMEN AND PELVIS WITHOUT CONTRAST TECHNIQUE: Multidetector CT imaging of the abdomen and pelvis was performed following the standard protocol without IV contrast. COMPARISON:  None. FINDINGS: Lung bases: Right greater than left dependent linear and reticular type opacities likely due to atelectasis, scarring or a combination. Heart is normal in size. Hepatobiliary: Fatty infiltration of the liver. Low-density homogeneous mass in the right lobe adjacent to the gallbladder measuring 2 cm consistent with a cyst. No other liver masses or lesions. Gallbladder is abnormal appearance. It has increased attenuation material throughout with evidence of wall thickening and adjacent inflammation. No bile duct dilation. Spleen, pancreas, adrenal glands:  Unremarkable. Kidneys, ureters, bladder: Mild right hydronephrosis with hydroureter extending to the right mid to distal ureter at the level of the pelvic ran. There are 2 adjacent stones in this location, larger measuring 6 mm. There are no additional stones below this. There are multiple nonobstructing intrarenal stones on the right. No left intrarenal stones. There are low-density renal masses. There is a 4.5 cm midpole mass in the right and a 2.6 cm midpole mass on the left, both consistent with cysts. No left hydronephrosis. Normal left ureter. Bladder is unremarkable. There  changes from previous prostate surgery. Lymph nodes:  No pathologically enlarged lymph nodes. Ascites:  None. Gastrointestinal: There is significant distention of the rectum with stool. There is some mild hazy inflammatory changes adjacent to the rectum and lower sigmoid colon. Scattered diverticula noted along the sigmoid colon. No evidence of diverticulitis. There is no evidence of bowel obstruction. There is no bowel wall thickening. Stomach is unremarkable. Normal appendix visualized. 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 Degenerative changes are noted along the visualized spine. No osteoblastic or osteolytic lesions. IMPRESSION: 1. Two adjacent stones lie in the mid to distal right ureter, largest measuring 6 mm, causing mild right hydroureteronephrosis. 2. Gallbladder also appears abnormal with increased attenuation material and evidence of wall thickening and adjacent inflammatory change. Consider acute cholecystitis if this correlates clinically, which could be further assessed with limited right upper quadrant ultrasound. 3. Rectum is significantly distended with stool and there is mild hazy inflammatory change in the perirectal fat in the fat adjacent to the lower sigmoid colon. Consider proctitis if this patient has rectal/anal pain. 4. No other acute findings. 5. Hepatic steatosis. Hepatic cyst. Bilateral renal cysts. Nonobstructing stones in the right kidney. Sigmoid colon diverticula without diverticulitis. Arthropathic changes  of the hips and degenerative changes of spine. Electronically Signed   By: Amie Portland M.D.   On: 04/23/2016 15:26   Dg Chest 1 View  04/17/2016  CLINICAL DATA:  75 year old male with history of sepsis. EXAM: CHEST 1 VIEW COMPARISON:  Chest x-ray a 04/15/2016. FINDINGS: Mild diffuse interstitial prominence throughout the lungs bilaterally. Diffuse peribronchial cuffing. No definite pleural effusions. No definite consolidative airspace disease. Heart size  appears within normal limits. The patient is extremely rotated to the right on today's exam, resulting in distortion of the mediastinal contours and reduced diagnostic sensitivity and specificity for mediastinal pathology. Surgical clips in the right hilar region. IMPRESSION: 1. Limited study secondary to extreme patient rotation to the right. With these limitations in mind, there does appear to be diffuse peribronchial cuffing and interstitial prominence, which suggests an acute bronchitis. Electronically Signed   By: Trudie Reed M.D.   On: 04/17/2016 15:09   Dg Abd 1 View  04/28/2016  CLINICAL DATA:  Orogastric tube placement.  Initial encounter. EXAM: ABDOMEN - 1 VIEW COMPARISON:  CT of the abdomen and pelvis performed earlier today at 3:08 p.m. FINDINGS: The patient's enteric tube is seen ending overlying the body of the stomach, with the side port about the fundus of the stomach. The visualized bowel gas pattern is grossly unremarkable. Air-filled loops of small and large bowel are noted. The stomach is partially filled with air. No free intra-abdominal air is seen, though evaluation for free air is limited on a single supine view. Clips are noted within the right upper quadrant, reflecting prior cholecystectomy. No acute osseous abnormalities are identified. IMPRESSION: 1. Enteric tube noted ending overlying the body of the stomach, with the side port about the fundus of the stomach. 2. Air-filled loops of small and large bowel may reflect mild dysmotility. Electronically Signed   By: Roanna Raider M.D.   On: 05/17/2016 20:12   US Venous Img Upper Uni Left  05/09/2016  CLINICAL DATA:  Left arm swelling x1 day. EXAM: LEFT UPPER EXTREMITY VENOUS DOPPLER ULTRASOUND TECHNIQUE: Gray-scale sonography with graded compression, as well as color Doppler and duplex ultrasound were performed to evaluate the upper extremity deep venous system from the level of the subclavian vein and including the jugular,  axillary, basilic and upper cephalic vein. Spectral Doppler was utilized to evaluate flow at rest and with distal augmentation maneuvers. COMPARISON:  None. FINDINGS: Thrombus within deep veins:  None visualized. Compressibility of deep veins:  Normal. Duplex waveform respiratory phasicity:  Normal. Duplex waveform response to augmentation:  Normal. Venous reflux:  None visualized. Other findings: Limited images of the contralateral right subclavian vein are unremarkable. IMPRESSION: 1. Negative for left upper extremity DVT. Electronically Signed   By: Corlis Leak M.D.   On: 05/09/2016 20:23   Dg Chest Port 1 View  05/13/2016  CLINICAL DATA:  Acute respiratory failure EXAM: PORTABLE CHEST 1 VIEW COMPARISON:  Chest x-ray dated 05/08/2016. FINDINGS: Endotracheal tube remains well positioned with tip just above the level of the carina. Left IJ central line is stable in position with tip at the level of the mid SVC. Enteric tube passes into the stomach. Overall cardiomediastinal silhouette is stable in size and configuration. Cardiomegaly is stable. Surgical clips again noted at the right hilum. Probable mild atelectasis at each lung base. Lungs otherwise clear. Right lung apex is partially obscured by overlying soft tissues. IMPRESSION: No significant interval change. Probable mild bibasilar atelectasis. Electronically Signed   By: Bary Richard  M.D.   On: 05/13/2016 10:41   Dg Chest Port 1 View  05/08/2016  CLINICAL DATA:  Respiratory failure. EXAM: PORTABLE CHEST 1 VIEW COMPARISON:  04/24/2016. FINDINGS: Interval placement of NG tube, its tip is below left hemidiaphragm P Endotracheal tube, left IJ line in stable position. Stable cardiomegaly. Mild bibasilar subsegmental atelectasis and/or infiltrates. Small bilateral pleural effusions. Postsurgical changes right lung. No pneumothorax . IMPRESSION: 1. Interval placement of NG tube, its tip is below left hemidiaphragm. Endotracheal tube and left IJ line stable  position. 2. Low lung volumes with mild bibasilar atelectasis and/or infiltrates again noted. Small bilateral pleural effusions. 3. Postsurgical changes right lung. Electronically Signed   By: Maisie Fus  Register   On: 05/08/2016 07:19   Dg Chest Port 1 View  05/13/2016  CLINICAL DATA:  Acute onset of hypotension and altered mental status. Urosepsis. Initial encounter. EXAM: PORTABLE CHEST 1 VIEW COMPARISON:  Chest radiograph performed earlier today at 2:09 p.m. FINDINGS: The patient's endotracheal tube is seen ending 5 cm above the carina. A left IJ line is noted ending about the mid SVC. Small bilateral pleural effusions are suggested. Mild bibasilar opacities may reflect pneumonia, given the patient's symptoms. No pneumothorax is seen. The cardiomediastinal silhouette is mildly enlarged. No acute osseous abnormalities are identified. IMPRESSION: 1. Endotracheal tube seen ending 5 cm above the carina. 2. Small bilateral pleural effusions suggested. Mild bibasilar opacities may reflect pneumonia, given the patient's symptoms. 3. Mild cardiomegaly. Electronically Signed   By: Roanna Raider M.D.   On: 05/11/2016 19:19   Dg Chest Port 1 View  04/24/2016  CLINICAL DATA:  Sepsis. EXAM: PORTABLE CHEST 1 VIEW COMPARISON:  Radiograph of Apr 17, 2016. FINDINGS: Stable cardiomediastinal silhouette. Right hilar clips are noted. No pneumothorax or pleural effusion is noted. No acute pulmonary disease is noted. Bony thorax is unremarkable. IMPRESSION: No acute cardiopulmonary abnormality seen. Electronically Signed   By: Lupita Raider, M.D.   On: 04/21/2016 14:29   Dg Chest Port 1 View  04/15/2016  CLINICAL DATA:  Sepsis.  Diaphoresis.  Fever.  Tachycardia. EXAM: PORTABLE CHEST 1 VIEW COMPARISON:  09/01/2012. FINDINGS: Poor inspiration. Interval mild enlargement of the cardiac silhouette. Interval mild prominence of the interstitial markings. Progressive elevation of the right hemidiaphragm. No airspace consolidation  seen. Diffuse osteopenia. Right hilar surgical clips are again demonstrated. IMPRESSION: 1. Interval mild cardiomegaly and mild interstitial lung disease with an appearance suggesting chronic interstitial lung disease. 2. No evidence of pneumonia on a single view. Electronically Signed   By: Beckie Salts M.D.   On: 04/15/2016 10:35   Dg Abd Portable 1v  05/11/2016  CLINICAL DATA:  Enteric tube placement EXAM: PORTABLE ABDOMEN - 1 VIEW COMPARISON:  05/01/2016 abdominal radiograph FINDINGS: Enteric tube terminates in the distal stomach. Surgical clips are seen in the right upper quadrant of the abdomen. Partially visualized right nephro ureteral stent with the proximal pigtail portion overlying the expected location of the right renal pelvis. Patchy bibasilar lung opacities. Mild gaseous distention of bowel loops in the visualized upper abdomen, probably colonic. IMPRESSION: 1. Enteric tube terminates in the distal stomach. 2. Mild gaseous distention of bowel loops in the visualized upper abdomen, favor colonic, probably a mild adynamic ileus. 3. Nonspecific patchy bibasilar lung opacities, correlate with chest radiograph as clinically warranted. Electronically Signed   By: Delbert Phenix M.D.   On: 05/11/2016 13:43     CBC  Recent Labs Lab 05/09/16 0422 05/10/16 0842 05/11/16 0606 05/13/16 0431  WBC  58.9* 50.2* 24.2* 15.7*  HGB 9.2* 9.1* 8.6* 8.8*  HCT 29.6* 27.4* 25.4* 26.6*  PLT 245 179 140* 125*  MCV 94.4 89.4 89.5 90.3  MCH 29.2 29.6 30.1 29.9  MCHC 31.0* 33.2 33.6 33.1  RDW 16.2* 15.3* 14.9* 15.3*    Chemistries   Recent Labs Lab 05/10/16 0842 05/11/16 0606 05/11/16 0906 05/12/16 0505 05/12/16 1817 05/13/16 0223 05/13/16 1106  NA 133* 136  --  142  --  148* 149*  K 4.2 2.9*  --  2.6* 2.8* 3.0* 3.2*  CL 98* 95*  --  95*  --  101 103  CO2 24 30  --  37*  --  36* 37*  GLUCOSE 113* 101*  --  99  --  111* 102*  BUN 36* 35*  --  32*  --  33* 38*  CREATININE 1.96* 1.88*  --  1.85*   --  1.95* 1.99*  CALCIUM 6.0* 6.0*  --  6.0*  --  6.5* 6.7*  MG  --   --  1.6* 2.1  --  1.9  --   AST 14*  --   --   --   --   --   --   ALT 10*  --   --   --   --   --   --   ALKPHOS 75  --   --   --   --   --   --   BILITOT 0.6  --   --   --   --   --   --    ------------------------------------------------------------------------------------------------------------------ estimated creatinine clearance is 40 mL/min (by C-G formula based on Cr of 1.99). ------------------------------------------------------------------------------------------------------------------ No results for input(s): HGBA1C in the last 72 hours. ------------------------------------------------------------------------------------------------------------------ No results for input(s): CHOL, HDL, LDLCALC, TRIG, CHOLHDL, LDLDIRECT in the last 72 hours. ------------------------------------------------------------------------------------------------------------------ No results for input(s): TSH, T4TOTAL, T3FREE, THYROIDAB in the last 72 hours.  Invalid input(s): FREET3 ------------------------------------------------------------------------------------------------------------------ No results for input(s): VITAMINB12, FOLATE, FERRITIN, TIBC, IRON, RETICCTPCT in the last 72 hours.  Coagulation profile No results for input(s): INR, PROTIME in the last 168 hours.  No results for input(s): DDIMER in the last 72 hours.  Cardiac Enzymes No results for input(s): CKMB, TROPONINI, MYOGLOBIN in the last 168 hours.  Invalid input(s): CK ------------------------------------------------------------------------------------------------------------------ Invalid input(s): POCBNP    Assessment & Plan  Patient is a 75 year old who was admitted with sepsis syndrome with below stated issues was transferred to the medical service who is currently comfort care. And on morphine drip. Comfort care, continue morphine drip. Unstable  for transfer to hospice.  Ventilator-dependent respiratory failure-resolved- supportive care Sepsis due to to urethral calculus- Comfort Care Metabolic acidosis Status post urethral stenting Ureteral calculus Hypokalemia Anemia of chronic disease Hypoglycemia, steroid-induced hyperglycemia Acute metabolic encephalopathy Dementia     Code Status Orders        Start     Ordered   05/14/16 0822  Do not attempt resuscitation (DNR)   Continuous    Question Answer Comment  In the event of cardiac or respiratory ARREST Do not call a "code blue"   In the event of cardiac or respiratory ARREST Do not perform Intubation, CPR, defibrillation or ACLS   In the event of cardiac or respiratory ARREST Use medication by any route, position, wound care, and other measures to relive pain and suffering. May use oxygen, suction and manual treatment of airway obstruction as needed for comfort.      05/14/16 [id number]  Code Status History    Date Active Date Inactive Code Status Order ID Comments User Context   05/09/2016 10:43 AM 05/14/2016  8:18 AM DNR [id number]  Erin Fulling, MD Inpatient   05/01/2016  6:42 PM 05/09/2016 10:43 AM Full Code [id number]  Merwyn Katos, MD Inpatient   04/23/2016  4:28 PM 05/04/2016  6:42 PM Full Code [id number]  Loleta Rose, MD ED   04/15/2016  2:02 PM 04/20/2016 10:10 PM Full Code [id number]  Adrian Saran, MD Inpatient    Advance Directive Documentation        Most Recent Value   Type of Advance Directive  Out of facility DNR (pink MOST or yellow form)   Pre-existing out of facility DNR order (yellow form or pink MOST form)     "MOST" Form in Place?                DVT Prophylaxis  Comfort Care  Lab Results  Component Value Date   PLT 125* 05/13/2016     Time Spent in minutes  20 minutes  Paetyn Pietrzak M.D on 05/15/2016 at 9:00 AM  Between 7am to 6pm - Pager - [phone]  After 6pm go to [email or web address] - password [email or web address] [facility]  [facility]    Office  [phone]

## 2016-05-15 NOTE — Progress Notes (Signed)
Patient resting comfortably this shift. Morphine drip continues at 7mg /hr. No signs or symptoms of pain or distress noted. Rectal tube draining brown liquid stool, minimal amount of leakage noted around tube with repositioning. Foley catheter draining dark concentrated urine. Repositioning and oral care performed as needed throughout the shift. Patient's son telephoned this morning and update given.

## 2016-05-16 NOTE — Progress Notes (Signed)
[facility] [place] at [facility]   PATIENT NAME: Nathan Figueroa    MR#:  [id number]  DATE OF BIRTH:  10/03/1941  SUBJECTIVE:  CHIEF COMPLAINT:   Chief Complaint  Patient presents with  . Altered Mental Status   - admitted for sepsis, respiratory failure- extubated- transferred from ICU for comfort care - remains on morphine drip, unresponsive, - comfortable appearing, no family at bedside this morning  REVIEW OF SYSTEMS:  Review of Systems  Unable to perform ROS: critical illness    DRUG ALLERGIES:  No Known Allergies  VITALS:  Blood pressure 72/44, pulse 102, temperature 99.5 F (37.5 C), temperature source Axillary, resp. rate 10, height 5\' 11"  (1.803 m), weight 100.7 kg (222 lb 0.1 oz), SpO2 96 %.  PHYSICAL EXAMINATION:  Physical Exam  GENERAL:  75 y.o.-year-old patient lying in the bed with no acute distress. Chronically ill appearing. EYES: Pupils equal, round, reactive to light and accommodation. No scleral icterus. Extraocular muscles intact.  HEENT: Head atraumatic, normocephalic. Oropharynx and nasopharynx clear.  NECK:  Supple, no jugular venous distention. No thyroid enlargement, no tenderness.  LUNGS: Coarse rhonchi bilaterally, decreased at the bases. Not using accessory muscles to breathe  CARDIOVASCULAR: S1, S2 normal. No murmurs, rubs, or gallops.  ABDOMEN: Soft, nontender, nondistended. Bowel sounds present. No organomegaly or mass.  EXTREMITIES: No pedal edema, cyanosis, or clubbing.  NEUROLOGIC: Patient is unresponsive.  PSYCHIATRIC: The patient is sedated and unresponsive SKIN: No obvious rash, lesion, or ulcer.    LABORATORY PANEL:   CBC  Recent Labs Lab 05/13/16 0431  WBC 15.7*  HGB 8.8*  HCT 26.6*  PLT 125*   ------------------------------------------------------------------------------------------------------------------  Chemistries   Recent Labs Lab 05/10/16 0842  05/13/16 0223 05/13/16 1106  NA  133*  < > 148* 149*  K 4.2  < > 3.0* 3.2*  CL 98*  < > 101 103  CO2 24  < > 36* 37*  GLUCOSE 113*  < > 111* 102*  BUN 36*  < > 33* 38*  CREATININE 1.96*  < > 1.95* 1.99*  CALCIUM 6.0*  < > 6.5* 6.7*  MG  --   < > 1.9  --   AST 14*  --   --   --   ALT 10*  --   --   --   ALKPHOS 75  --   --   --   BILITOT 0.6  --   --   --   < > = values in this interval not displayed. ------------------------------------------------------------------------------------------------------------------  Cardiac Enzymes No results for input(s): TROPONINI in the last 168 hours. ------------------------------------------------------------------------------------------------------------------  RADIOLOGY:  No results found.  EKG:   Orders placed or performed during the hospital encounter of 2016-11-20  . EKG 12-Lead  . EKG 12-Lead  . EKG 12-Lead  . EKG 12-Lead    ASSESSMENT AND PLAN:   75 year old male with past medical history significant for BPH, UTIs, hypertension, anemia is admitted on 2016/07/16 with abdominal pain and sepsis.  Patient was initially admitted to ICU, was on pressors for his sepsis and also IV antibiotics. He was intubated for respiratory failure and extubated on 05/13/2016. He has failed multiple spontaneous breathing trials while in ICU. The ICU attendings discussed with his family who do not want aggressive measures like tracheostomy or PEG tube placement. Patient was placed on comfort care on 05/14/2016 and transferred to medical floor. He is currently on morphine drip at 9 mg/h, appears comfortable. Also on scopolamine patch  for increased secretions. Patient is a DO NOT RESUSCITATE. Continue to monitor at this time as unstable to transfer to hospice home.  Final diagnoses:  1. Sepsis 2. Urinary tract infection 3. Metabolic encephalopathy 4. Acute respiratory failure 5. Right renal calculus with hydronephrosis 6. Acute renal failure 7. Hypertension 8. Anemia    All  the records are reviewed and case discussed with Care Management/Social Workerr. Management plans discussed with the patient, family and they are in agreement.  CODE STATUS: DNR  TOTAL TIME TAKING CARE OF THIS PATIENT: 15 minutes.     Enid BaasKALISETTI,Nathan Figueroa M.D on 05/16/2016 at 8:43 AM  Between 7am to 6pm - Pager - [phone]  After 6pm go to [email or web address] - password [email or web address] [facility]  [facility]  Office  [phone]  CC: Primary care physician; Dorothey BasemanAVID BRONSTEIN, MD

## 2016-05-16 NOTE — Plan of Care (Signed)
Problem: Safety: Goal: Ability to remain free from injury will improve Outcome: Progressing No injury  Cont comfort care  Support to pt and family

## 2016-05-17 NOTE — Care Management Important Message (Signed)
Important Message  Patient Details  Name: Nathan Figueroa MRN: [id number] Date of Birth: 01/25/1941   Medicare Important Message Given:  Yes    Gwenette GreetBrenda S Alysiah Suppa, RN 05/17/2016, 11:21 AM

## 2016-05-17 NOTE — Progress Notes (Signed)
[facility] - [place] [facility]   PATIENT NAME: Nathan Figueroa Figueroa    MR#:  [id number]  DATE OF BIRTH:  10/08/1941  SUBJECTIVE:  CHIEF COMPLAINT:   Chief Complaint  Patient presents with  . Altered Mental Status   - admitted for sepsis, respiratory failure- extubated- transferred from ICU for comfort care - remains on morphine drip at 9mg /hr, unresponsive, - comfortable appearing, no family at bedside  REVIEW OF SYSTEMS:  Review of Systems  Unable to perform ROS: critical illness    DRUG ALLERGIES:  No Known Allergies  VITALS:  Blood pressure 72/47, pulse 102, temperature 97.7 F (36.5 C), temperature source Axillary, resp. rate 10, height 5\' 11"  (1.803 m), weight 100.7 kg (222 lb 0.1 oz), SpO2 85 %.  PHYSICAL EXAMINATION:  Physical Exam  GENERAL:  75 y.o.-year-old patient lying in the bed with no acute distress. Chronically ill appearing. EYES: Pupils equal, round, reactive to light and accommodation. No scleral icterus. Extraocular muscles intact.  HEENT: Head atraumatic, normocephalic. Oropharynx and nasopharynx clear.  NECK:  Supple, no jugular venous distention. No thyroid enlargement, no tenderness.  LUNGS: Coarse rhonchi bilaterally, decreased at the bases. Not using accessory muscles to breathe  CARDIOVASCULAR: S1, S2 normal. No murmurs, rubs, or gallops.  ABDOMEN: Soft, nontender, nondistended. Bowel sounds present. No organomegaly or mass.  EXTREMITIES: No pedal edema, cyanosis, or clubbing.  NEUROLOGIC: Patient is unresponsive.  PSYCHIATRIC: The patient is sedated and unresponsive SKIN: No obvious rash, lesion, or ulcer.    LABORATORY PANEL:   CBC  Recent Labs Lab 05/13/16 0431  WBC 15.7*  HGB 8.8*  HCT 26.6*  PLT 125*   ------------------------------------------------------------------------------------------------------------------  Chemistries   Recent Labs Lab 05/13/16 0223 05/13/16 1106  NA 148* 149*  K 3.0*  3.2*  CL 101 103  CO2 36* 37*  GLUCOSE 111* 102*  BUN 33* 38*  CREATININE 1.95* 1.99*  CALCIUM 6.5* 6.7*  MG 1.9  --    ------------------------------------------------------------------------------------------------------------------  Cardiac Enzymes No results for input(s): TROPONINI in the last 168 hours. ------------------------------------------------------------------------------------------------------------------  RADIOLOGY:  No results found.  EKG:   Orders placed or performed during the hospital encounter of 04/28/2016  . EKG 12-Lead  . EKG 12-Lead  . EKG 12-Lead  . EKG 12-Lead    ASSESSMENT AND PLAN:   75 year old male with past medical history significant for BPH, UTIs, hypertension, anemia is admitted on 05/13/2016 with abdominal pain and sepsis.  Patient was initially admitted to ICU, was on pressors for his sepsis and also IV antibiotics. He was intubated for respiratory failure and extubated on 05/13/2016. He has failed multiple spontaneous breathing trials while in ICU. The ICU attendings discussed with his family who do not want aggressive measures like tracheostomy or PEG tube placement. Patient was placed on comfort care on 05/14/2016 and transferred to medical floor. He is currently on morphine drip at 9 mg/h, appears comfortable. Also on scopolamine patch for increased secretions. Patient is a DO NOT RESUSCITATE. Continue to monitor at this time as unstable to transfer to hospice home.  Final diagnoses:  1. Sepsis 2. Urinary tract infection 3. Metabolic encephalopathy 4. Acute respiratory failure 5. Right renal calculus with hydronephrosis 6. Acute renal failure 7. Hypertension 8. Anemia    All the records are reviewed and case discussed with Care Management/Social Workerr. Management plans discussed with the patient, family and they are in agreement.  CODE STATUS: DNR  TOTAL TIME TAKING CARE OF THIS PATIENT: 15 minutes.  Enid BaasKALISETTI,Pantera Winterrowd M.D on 05/17/2016 at 9:33 AM  Between 7am to 6pm - Pager - [phone]  After 6pm go to [email or web address] - password [email or web address] [facility]  [facility]  Office  [phone]  CC: Primary care physician; Dorothey BasemanAVID BRONSTEIN, MD

## 2016-05-17 NOTE — Plan of Care (Signed)
Problem: Health Behavior/Discharge Planning: Goal: Ability to manage health-related needs will improve Outcome: Not Progressing Comfort care continues. Support to pt and family

## 2016-05-17 NOTE — Progress Notes (Signed)
Pt comfort care.  No change in condition from yesterday.  Periods of apnea.  Morphine drip maintains at 549ml/hr.

## 2016-05-19 NOTE — Progress Notes (Signed)
   05/19/16 0100  Clinical Encounter Type  Visited With Family  Visit Type Death  Referral From Nurse  Consult/Referral To Chaplain  Spiritual Encounters  Spiritual Needs Prayer;Grief support  Stress Factors  Patient Stress Factors Other (Comment) (Patient passed)  Family Stress Factors Major life changes  Prayed and visited with family as they grieved the death of their father.

## 2016-05-20 NOTE — Progress Notes (Signed)
[facility] [place] at [facility]   PATIENT NAME: Nathan Figueroa    MR#:  [id number]  DATE OF BIRTH:  01/13/1941  SUBJECTIVE:  CHIEF COMPLAINT:   Chief Complaint  Patient presents with  . Altered Mental Status   - admitted for sepsis, respiratory failure- extubated- transferred from ICU for comfort care - remains on morphine drip at 9mg /hr, unresponsive,  no Overnight changes. Remains hypotensive and hypoxic.  REVIEW OF SYSTEMS:  Review of Systems  Unable to perform ROS: critical illness    DRUG ALLERGIES:  No Known Allergies  VITALS:  Blood pressure 56/34, pulse 75, temperature 99.1 F (37.3 C), temperature source Oral, resp. rate 16, height 5\' 11"  (1.803 m), weight 100.7 kg (222 lb 0.1 oz), SpO2 87 %.  PHYSICAL EXAMINATION:  Physical Exam  GENERAL:  75 y.o.-year-old patient lying in the bed with no acute distress. Chronically ill appearing. EYES: Pupils equal, round, reactive to light and accommodation. No scleral icterus. Extraocular muscles intact.  HEENT: Head atraumatic, normocephalic. Oropharynx and nasopharynx clear.  NECK:  Supple, no jugular venous distention. No thyroid enlargement, no tenderness.  LUNGS: Coarse rhonchi bilaterally, decreased at the bases. Not using accessory muscles to breathe  CARDIOVASCULAR: S1, S2 normal. No murmurs, rubs, or gallops.  ABDOMEN: Soft, nontender, nondistended. Bowel sounds present. No organomegaly or mass.  EXTREMITIES: No pedal edema, cyanosis, or clubbing.  NEUROLOGIC: Patient is unresponsive.  PSYCHIATRIC: The patient is sedated and unresponsive SKIN: No obvious rash, lesion, or ulcer.    LABORATORY PANEL:   CBC  Recent Labs Lab 05/13/16 0431  WBC 15.7*  HGB 8.8*  HCT 26.6*  PLT 125*   ------------------------------------------------------------------------------------------------------------------  Chemistries   Recent Labs Lab 05/13/16 0223 05/13/16 1106  NA 148* 149*  K  3.0* 3.2*  CL 101 103  CO2 36* 37*  GLUCOSE 111* 102*  BUN 33* 38*  CREATININE 1.95* 1.99*  CALCIUM 6.5* 6.7*  MG 1.9  --    ------------------------------------------------------------------------------------------------------------------  Cardiac Enzymes No results for input(s): TROPONINI in the last 168 hours. ------------------------------------------------------------------------------------------------------------------  RADIOLOGY:  No results found.  EKG:   Orders placed or performed during the hospital encounter of October 09, 2016  . EKG 12-Lead  . EKG 12-Lead  . EKG 12-Lead  . EKG 12-Lead    ASSESSMENT AND PLAN:   75 year old male with past medical history significant for BPH, UTIs, hypertension, anemia is admitted on 2016/04/16 with abdominal pain and sepsis.  Patient was initially admitted to ICU, was on pressors for his sepsis and also IV antibiotics. He was intubated for respiratory failure and extubated on 05/13/2016. He has failed multiple spontaneous breathing trials while in ICU. The ICU attendings discussed with his family who do not want aggressive measures like tracheostomy or PEG tube placement. Patient was placed on comfort care on 05/14/2016 and transferred to medical floor. He is currently on morphine drip at 9 mg/h, appears comfortable. Also on scopolamine patch for increased secretions. Patient is a DO NOT RESUSCITATE.  Patient not stable for hospice home because of hypotension and hypoxia. Appreciate palliative care follow-up today. Final diagnoses:  1. Sepsis 2. Urinary tract infection 3. Metabolic encephalopathy 4. Acute respiratory failure 5. Right renal calculus with hydronephrosis 6. Acute renal failure 7. Hypertension 8. Anemia    All the records are reviewed and case discussed with Care Management/Social Workerr. Management plans discussed with the patient, family and they are in agreement.  CODE STATUS: DNR  TOTAL TIME TAKING CARE OF  THIS PATIENT:  15 minutes.     Katha HammingKONIDENA,Yussuf Sawyers M.D on 2016/09/14 at 8:30 AM  Between 7am to 6pm - Pager - [phone]  After 6pm go to [email or web address] - password [email or web address] [facility]  [facility]  Office  [phone]  CC: Primary care physician; Dorothey BasemanAVID BRONSTEIN, MD

## 2016-05-20 NOTE — Discharge Summary (Signed)
    Death Note please see Last Note for all details.   In breif -75 year old male patient admitted for sepsis, acute respiratory failure, altered mental status. Sepsis with metabolic encephalopathy on admission with UTI and has Started on antibiotics vancomycin, Zosyn, patient was on vent support for vent dependent respiratory failure, also on pressors for septic shock, and also had acute renal failure with obstructive uropathy within the right ureter calculus. Because of failure to recover from the vent and failing multiple weaning attempts, family decided on comfort measures. On transfer from ICU, started on morphine drip. He was continued on morphine drip for comfort care, he expired on June 30 at 1:50 AM.    Nathan Figueroa CSN:[id number],MRN:[id number] is a 75 y.o. male, Outpatient Primary MD for the patient is Dorothey BasemanAVID BRONSTEIN, MD  Pronounced dead by the registered nurse on June 30 at 1:50 AM.               Cause of death  1. Sepsis 2. Urinary tract infection 3. Metabolic encephalopathy 4. Acute respiratory failure 5. Right renal calculus with hydronephrosis 6. Acute renal failure 7. Hypertension 8. Anemia  Total clinical and documentation time for today Under 30 minutes   Last Note

## 2016-05-20 DEATH — deceased

## 2016-06-20 DEATH — deceased

## 2017-03-25 IMAGING — CT CT ABD-PELV W/O CM
2 of 4 series · 15 of 46 positions shown, 17 images · non-contrast
Comparison: None.

CLINICAL DATA: Pt very poor historian. Pt states that his belly
hurts. Per note in pt chart "Pt presents to ED from [REDACTED]
with a c/o altered mental status and code sepsis. Per EMS pt was
hospitalized for a week for pneumonia and was started on levaquin
yesterday. Per EMS pt is normally alert and oriented, presents to ED
confused and per EMS combative. Pt presents to ED with staff moaning
and with incomprehensible speech. Pt noted to be pale on arrival."
No IV contrast per GFR value, pt unable to drink PO contrast.

EXAM:
CT ABDOMEN AND PELVIS WITHOUT CONTRAST
TECHNIQUE: Multidetector CT imaging of the abdomen and pelvis was performed
following the standard protocol without IV contrast.

[Series 2: routine abd pel wo · axial · 0.92mm/px · z∈[-1256,-801]mm · 12 of 101 slices shown, 14 images]
[im 5/101  soft-tissue]
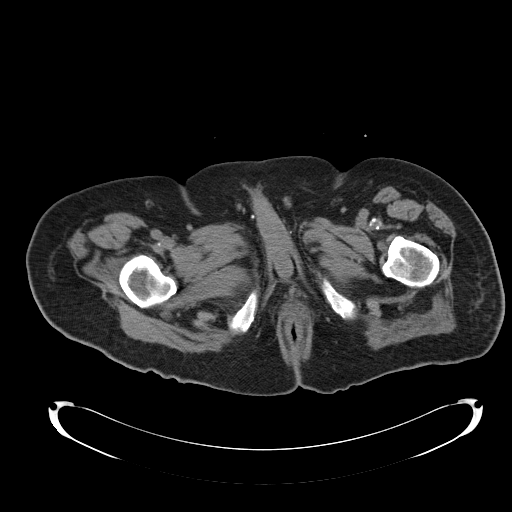
[im 5/101  bone]
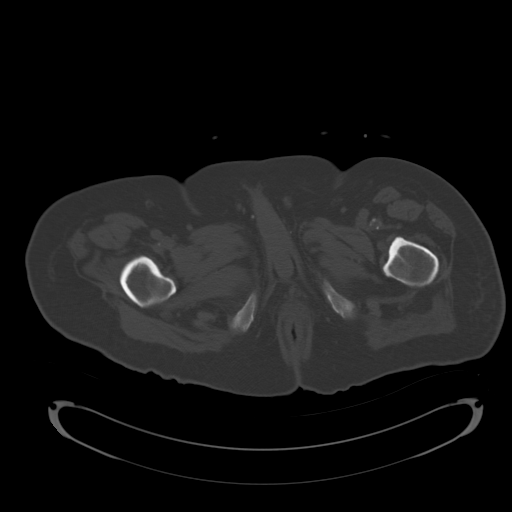
[im 13/101  soft-tissue]
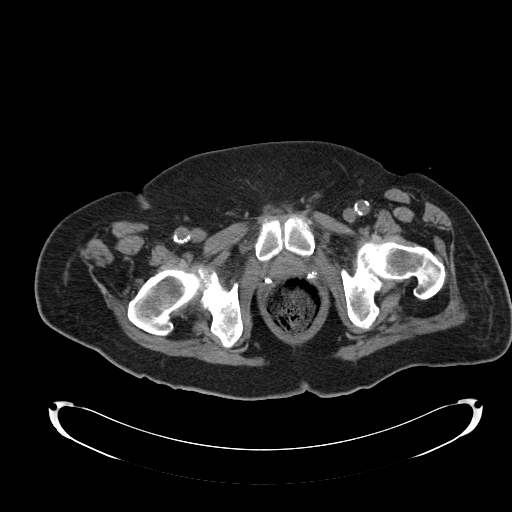
[im 21/101  soft-tissue]
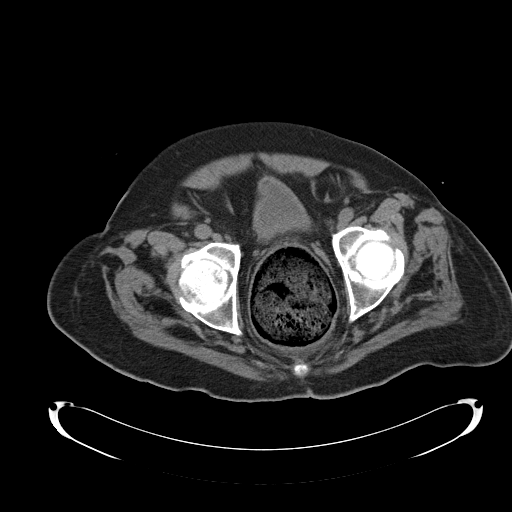
[im 30/101  soft-tissue]
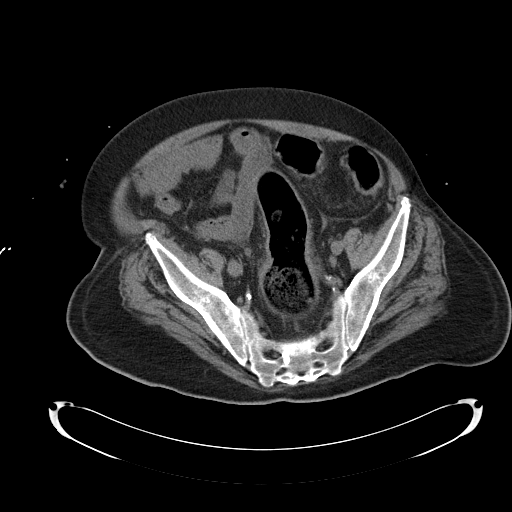
[im 38/101  soft-tissue]
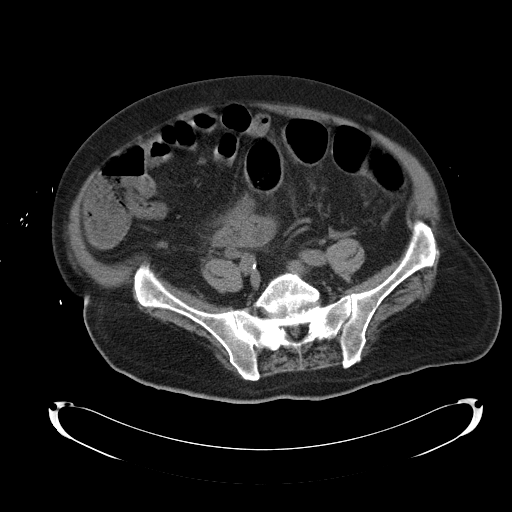
[im 46/101  soft-tissue]
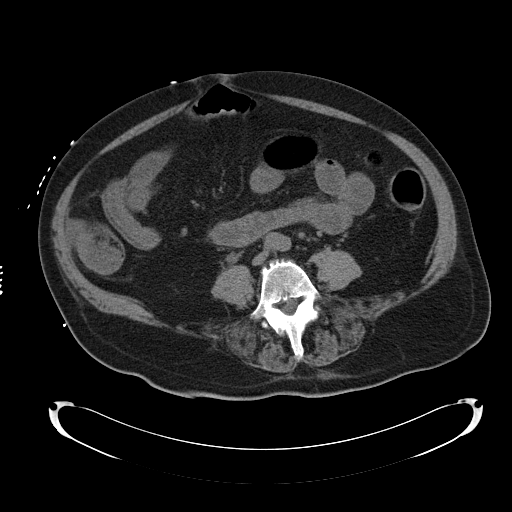
[im 55/101  soft-tissue]
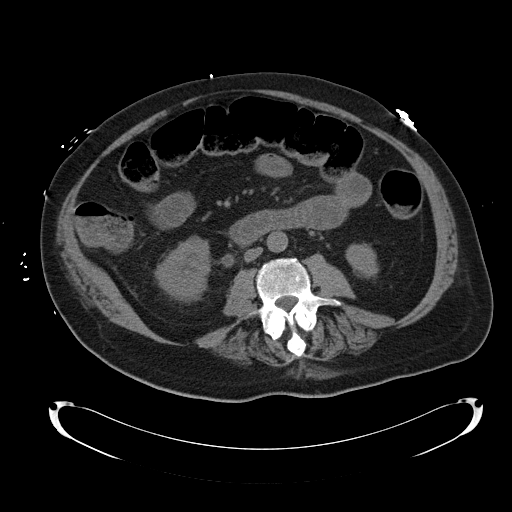
[im 63/101  soft-tissue]
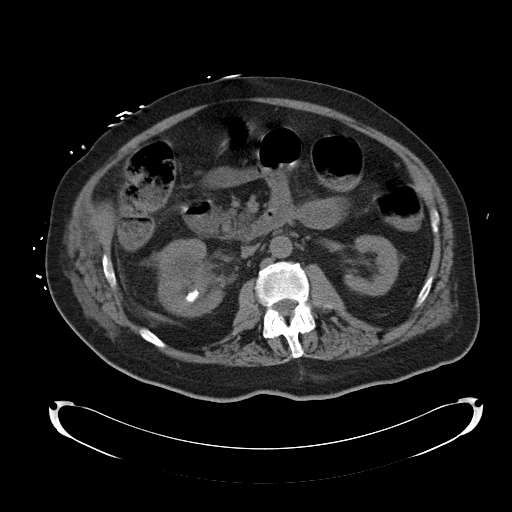
[im 71/101  soft-tissue]
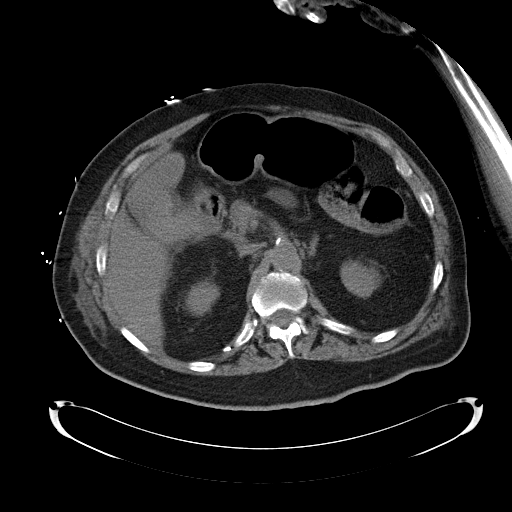
[im 71/101  bone]
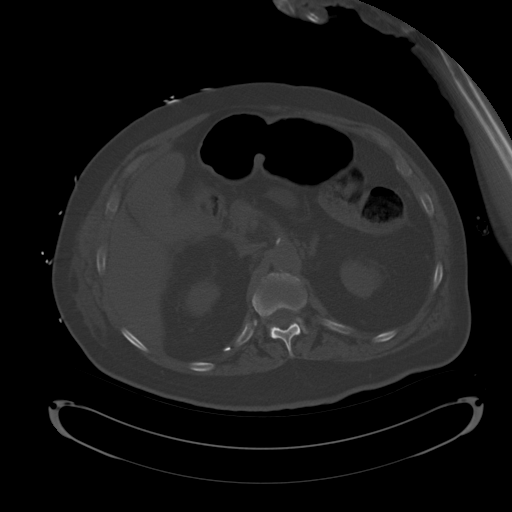
[im 80/101  soft-tissue]
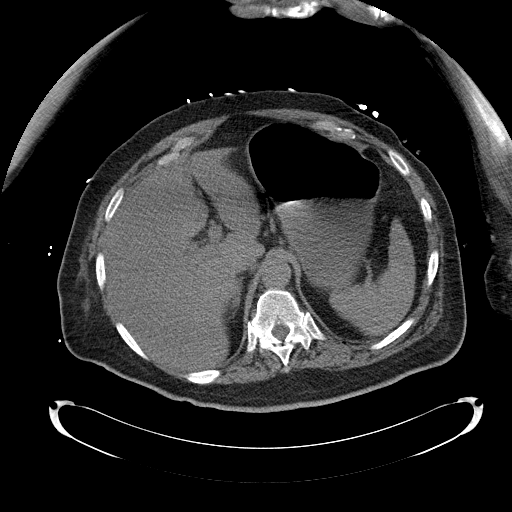
[im 88/101  soft-tissue]
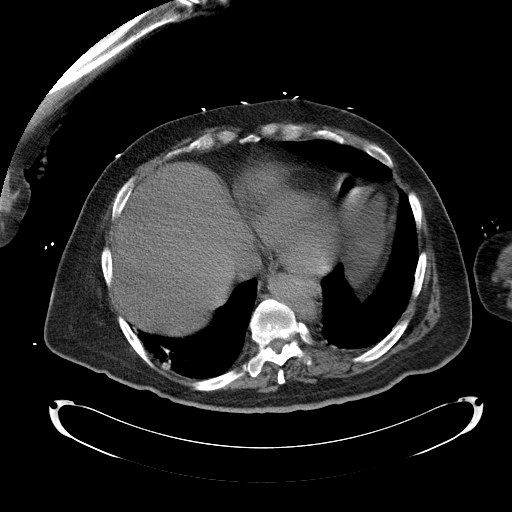
[im 96/101  soft-tissue]
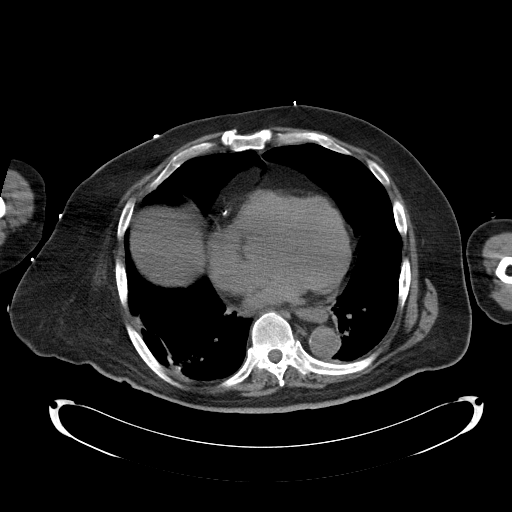

[Series 5: cor routine abd pel wo · coronal · 0.83mm/px · 3 of 148 slices shown]
[im 50/148  soft-tissue]
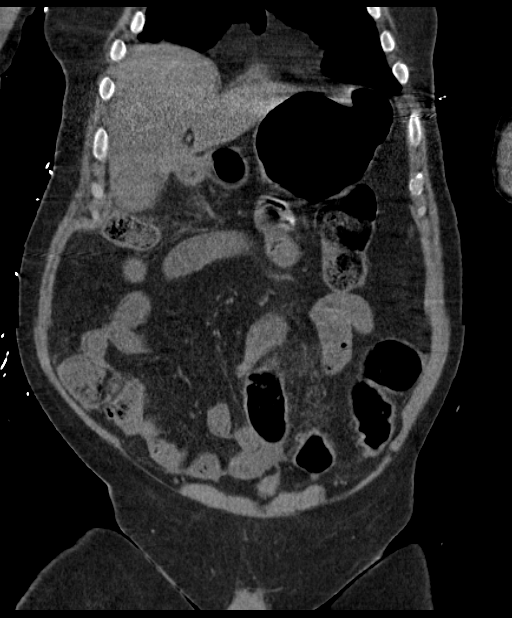
[im 66/148  soft-tissue]
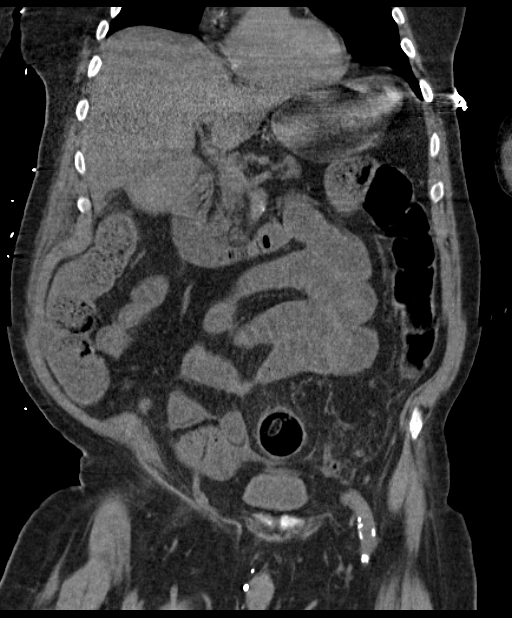
[im 82/148  soft-tissue]
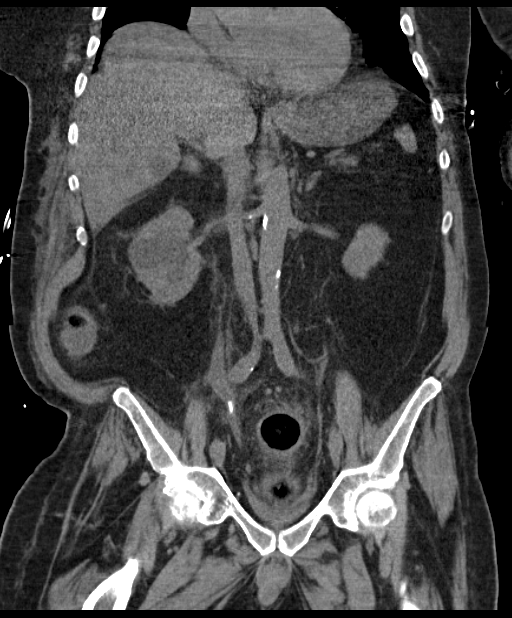

[15 of 46 positions shown; findings below may reference images not displayed]

FINDINGS: Lung bases: Right greater than left dependent linear and reticular
type opacities likely due to atelectasis, scarring or a combination.
Heart is normal in size.

Hepatobiliary: Fatty infiltration of the liver. Low-density
homogeneous mass in the right lobe adjacent to the gallbladder
measuring 2 cm consistent with a cyst. No other liver masses or
lesions. Gallbladder is abnormal appearance. It has increased
attenuation material throughout with evidence of wall thickening and
adjacent inflammation. No bile duct dilation.

Spleen, pancreas, adrenal glands:  Unremarkable.

Kidneys, ureters, bladder: Mild right hydronephrosis with
hydroureter extending to the right mid to distal ureter at the level
of the pelvic ran. There are 2 adjacent stones in this location,
larger measuring 6 mm. There are no additional stones below this.
There are multiple nonobstructing intrarenal stones on the right. No
left intrarenal stones. There are low-density renal masses. There is
a 4.5 cm midpole mass in the right and a 2.6 cm midpole mass on the
left, both consistent with cysts. No left hydronephrosis. Normal
left ureter. Bladder is unremarkable.

There changes from previous prostate surgery.

Lymph nodes:  No pathologically enlarged lymph nodes.

Ascites:  None.

Gastrointestinal: There is significant distention of the rectum with
stool. There is some mild hazy inflammatory changes adjacent to the
rectum and lower sigmoid colon. Scattered diverticula noted along
the sigmoid colon. No evidence of diverticulitis. There is no
evidence of bowel obstruction. There is no bowel wall thickening.
Stomach is unremarkable. Normal appendix visualized.

Musculoskeletal: There arthropathic changes of both hips.
Degenerative changes are noted along the visualized spine. No
osteoblastic or osteolytic lesions.
IMPRESSION: 1. Two adjacent stones lie in the mid to distal right ureter,
largest measuring 6 mm, causing mild right hydroureteronephrosis.
2. Gallbladder also appears abnormal with increased attenuation
material and evidence of wall thickening and adjacent inflammatory
change. Consider acute cholecystitis if this correlates clinically,
which could be further assessed with limited right upper quadrant
ultrasound.
3. Rectum is significantly distended with stool and there is mild
hazy inflammatory change in the perirectal fat in the fat adjacent
to the lower sigmoid colon. Consider proctitis if this patient has
rectal/anal pain.
4. No other acute findings.
5. Hepatic steatosis. Hepatic cyst. Bilateral renal cysts.
Nonobstructing stones in the right kidney. Sigmoid colon diverticula
without diverticulitis. Arthropathic changes of the hips and
degenerative changes of spine.

## 2017-03-26 IMAGING — DX DG CHEST 1V PORT
1 series · 1 of 1 positions shown · non-contrast
Comparison: 05/07/2016.

CLINICAL DATA: Respiratory failure.

EXAM:
PORTABLE CHEST 1 VIEW

[chest ap]
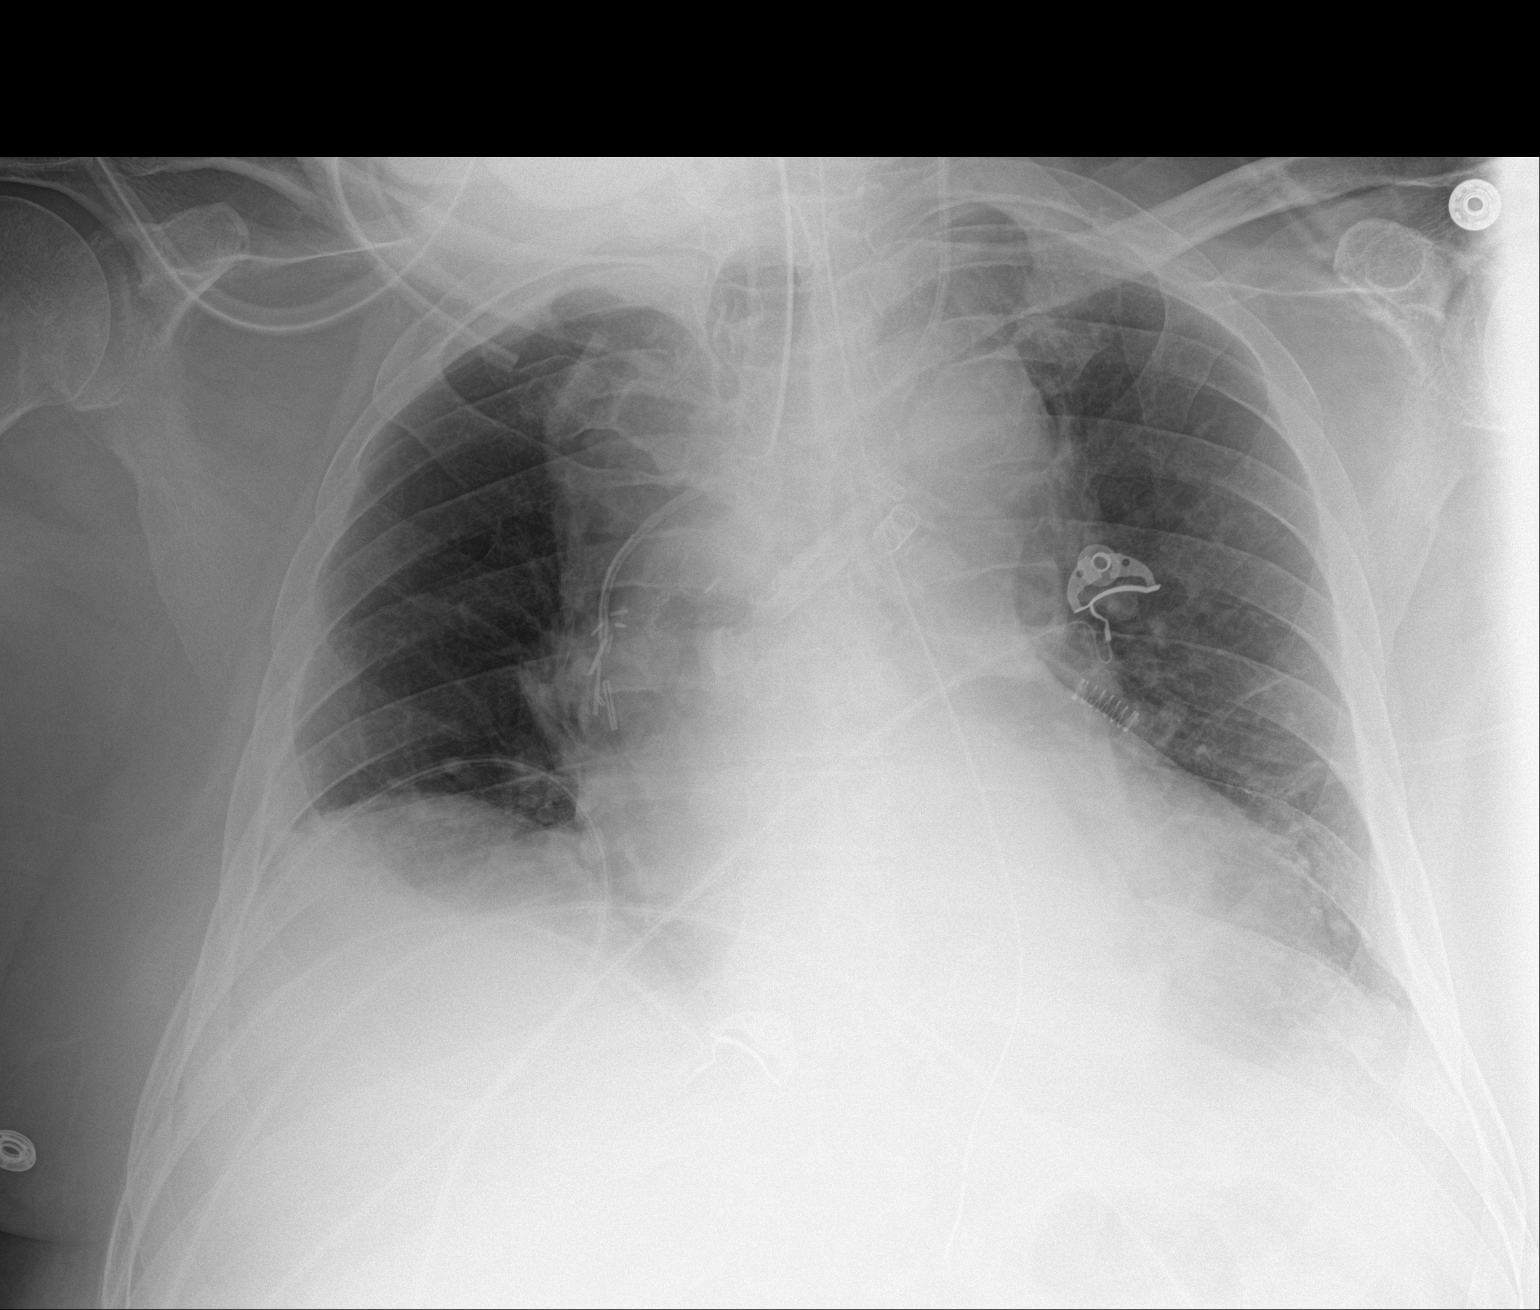

[1 of 1 positions shown; findings below may reference images not displayed]

FINDINGS: Interval placement of NG tube, its tip is below left hemidiaphragm P
Endotracheal tube, left IJ line in stable position. Stable
cardiomegaly. Mild bibasilar subsegmental atelectasis and/or
infiltrates. Small bilateral pleural effusions. Postsurgical changes
right lung. No pneumothorax .
IMPRESSION: 1. Interval placement of NG tube, its tip is below left
hemidiaphragm. Endotracheal tube and left IJ line stable position.

2. Low lung volumes with mild bibasilar atelectasis and/or
infiltrates again noted. Small bilateral pleural effusions.

3. Postsurgical changes right lung.

## 2017-03-31 IMAGING — DX DG CHEST 1V PORT
1 series · 1 of 1 positions shown · non-contrast
Comparison: Chest x-ray dated 05/08/2016.

CLINICAL DATA: Acute respiratory failure

EXAM:
PORTABLE CHEST 1 VIEW

[chest ap]
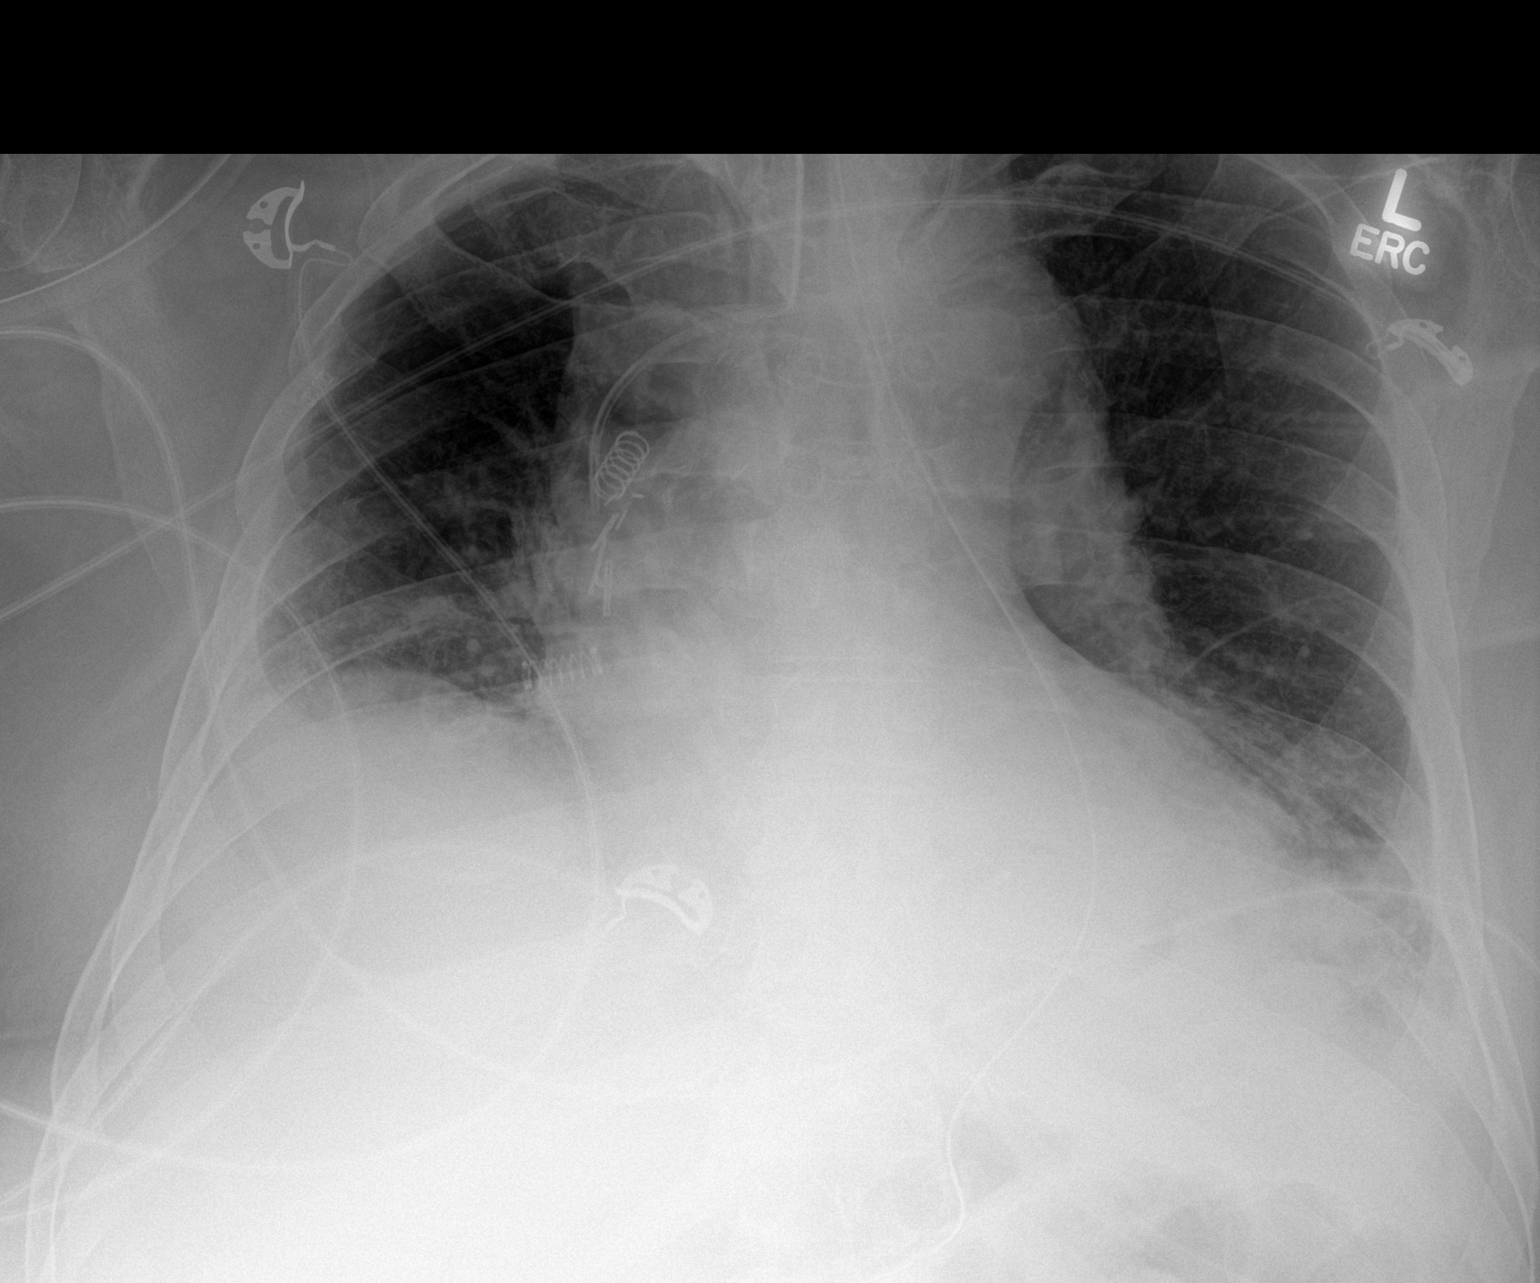

[1 of 1 positions shown; findings below may reference images not displayed]

FINDINGS: Endotracheal tube remains well positioned with tip just above the
level of the carina. Left IJ central line is stable in position with
tip at the level of the mid SVC. Enteric tube passes into the
stomach. Overall cardiomediastinal silhouette is stable in size and
configuration. Cardiomegaly is stable. Surgical clips again noted at
the right hilum.

Probable mild atelectasis at each lung base. Lungs otherwise clear.
Right lung apex is partially obscured by overlying soft tissues.
IMPRESSION: No significant interval change. Probable mild bibasilar atelectasis.
